# Patient Record
Sex: Male | Born: 1945 | Race: White | Hispanic: No | Marital: Married | Smoking: Never smoker
Health system: Southern US, Community
[De-identification: ages and names within clinical notes are randomized; demographics above are authoritative.]

## PROBLEM LIST (undated history)

## (undated) DIAGNOSIS — E7800 Pure hypercholesterolemia, unspecified: Secondary | ICD-10-CM

## (undated) DIAGNOSIS — I10 Essential (primary) hypertension: Secondary | ICD-10-CM

## (undated) DIAGNOSIS — I441 Atrioventricular block, second degree: Secondary | ICD-10-CM

## (undated) HISTORY — PX: COLONOSCOPY: SHX174

---

## 2003-01-02 ENCOUNTER — Ambulatory Visit (HOSPITAL_COMMUNITY): Admission: RE | Admit: 2003-01-02 | Discharge: 2003-01-02 | Payer: Self-pay | Admitting: *Deleted

## 2014-03-10 DIAGNOSIS — D124 Benign neoplasm of descending colon: Secondary | ICD-10-CM | POA: Diagnosis not present

## 2014-03-10 DIAGNOSIS — K640 First degree hemorrhoids: Secondary | ICD-10-CM | POA: Diagnosis not present

## 2014-03-10 DIAGNOSIS — K5730 Diverticulosis of large intestine without perforation or abscess without bleeding: Secondary | ICD-10-CM | POA: Diagnosis not present

## 2014-03-10 DIAGNOSIS — Z1211 Encounter for screening for malignant neoplasm of colon: Secondary | ICD-10-CM | POA: Diagnosis not present

## 2014-03-10 DIAGNOSIS — D123 Benign neoplasm of transverse colon: Secondary | ICD-10-CM | POA: Diagnosis not present

## 2014-03-10 DIAGNOSIS — D122 Benign neoplasm of ascending colon: Secondary | ICD-10-CM | POA: Diagnosis not present

## 2014-03-10 DIAGNOSIS — D126 Benign neoplasm of colon, unspecified: Secondary | ICD-10-CM | POA: Diagnosis not present

## 2014-03-10 DIAGNOSIS — D120 Benign neoplasm of cecum: Secondary | ICD-10-CM | POA: Diagnosis not present

## 2014-03-10 DIAGNOSIS — K633 Ulcer of intestine: Secondary | ICD-10-CM | POA: Diagnosis not present

## 2014-06-16 DIAGNOSIS — M109 Gout, unspecified: Secondary | ICD-10-CM | POA: Diagnosis not present

## 2014-06-16 DIAGNOSIS — E780 Pure hypercholesterolemia: Secondary | ICD-10-CM | POA: Diagnosis not present

## 2014-06-16 DIAGNOSIS — Z23 Encounter for immunization: Secondary | ICD-10-CM | POA: Diagnosis not present

## 2014-06-16 DIAGNOSIS — I10 Essential (primary) hypertension: Secondary | ICD-10-CM | POA: Diagnosis not present

## 2014-12-24 DIAGNOSIS — Z23 Encounter for immunization: Secondary | ICD-10-CM | POA: Diagnosis not present

## 2014-12-24 DIAGNOSIS — R7301 Impaired fasting glucose: Secondary | ICD-10-CM | POA: Diagnosis not present

## 2014-12-24 DIAGNOSIS — E7800 Pure hypercholesterolemia, unspecified: Secondary | ICD-10-CM | POA: Diagnosis not present

## 2014-12-24 DIAGNOSIS — I10 Essential (primary) hypertension: Secondary | ICD-10-CM | POA: Diagnosis not present

## 2014-12-24 DIAGNOSIS — M109 Gout, unspecified: Secondary | ICD-10-CM | POA: Diagnosis not present

## 2014-12-24 DIAGNOSIS — Z0000 Encounter for general adult medical examination without abnormal findings: Secondary | ICD-10-CM | POA: Diagnosis not present

## 2014-12-24 DIAGNOSIS — Z125 Encounter for screening for malignant neoplasm of prostate: Secondary | ICD-10-CM | POA: Diagnosis not present

## 2015-06-29 DIAGNOSIS — I10 Essential (primary) hypertension: Secondary | ICD-10-CM | POA: Diagnosis not present

## 2015-06-29 DIAGNOSIS — E7800 Pure hypercholesterolemia, unspecified: Secondary | ICD-10-CM | POA: Diagnosis not present

## 2015-06-29 DIAGNOSIS — M109 Gout, unspecified: Secondary | ICD-10-CM | POA: Diagnosis not present

## 2015-12-27 DIAGNOSIS — M109 Gout, unspecified: Secondary | ICD-10-CM | POA: Diagnosis not present

## 2015-12-27 DIAGNOSIS — Z0000 Encounter for general adult medical examination without abnormal findings: Secondary | ICD-10-CM | POA: Diagnosis not present

## 2015-12-27 DIAGNOSIS — I10 Essential (primary) hypertension: Secondary | ICD-10-CM | POA: Diagnosis not present

## 2015-12-27 DIAGNOSIS — E7800 Pure hypercholesterolemia, unspecified: Secondary | ICD-10-CM | POA: Diagnosis not present

## 2015-12-27 DIAGNOSIS — Z125 Encounter for screening for malignant neoplasm of prostate: Secondary | ICD-10-CM | POA: Diagnosis not present

## 2015-12-27 DIAGNOSIS — Z23 Encounter for immunization: Secondary | ICD-10-CM | POA: Diagnosis not present

## 2015-12-27 DIAGNOSIS — N529 Male erectile dysfunction, unspecified: Secondary | ICD-10-CM | POA: Diagnosis not present

## 2016-06-26 DIAGNOSIS — R001 Bradycardia, unspecified: Secondary | ICD-10-CM | POA: Diagnosis not present

## 2016-06-26 DIAGNOSIS — M109 Gout, unspecified: Secondary | ICD-10-CM | POA: Diagnosis not present

## 2016-06-26 DIAGNOSIS — E7800 Pure hypercholesterolemia, unspecified: Secondary | ICD-10-CM | POA: Diagnosis not present

## 2016-06-26 DIAGNOSIS — I10 Essential (primary) hypertension: Secondary | ICD-10-CM | POA: Diagnosis not present

## 2016-07-18 DIAGNOSIS — E7800 Pure hypercholesterolemia, unspecified: Secondary | ICD-10-CM | POA: Diagnosis not present

## 2016-07-18 DIAGNOSIS — I10 Essential (primary) hypertension: Secondary | ICD-10-CM | POA: Diagnosis not present

## 2016-12-27 DIAGNOSIS — Z86010 Personal history of colonic polyps: Secondary | ICD-10-CM | POA: Diagnosis not present

## 2016-12-27 DIAGNOSIS — I10 Essential (primary) hypertension: Secondary | ICD-10-CM | POA: Diagnosis not present

## 2016-12-27 DIAGNOSIS — E7800 Pure hypercholesterolemia, unspecified: Secondary | ICD-10-CM | POA: Diagnosis not present

## 2016-12-27 DIAGNOSIS — Z0000 Encounter for general adult medical examination without abnormal findings: Secondary | ICD-10-CM | POA: Diagnosis not present

## 2016-12-27 DIAGNOSIS — Z23 Encounter for immunization: Secondary | ICD-10-CM | POA: Diagnosis not present

## 2016-12-27 DIAGNOSIS — Z125 Encounter for screening for malignant neoplasm of prostate: Secondary | ICD-10-CM | POA: Diagnosis not present

## 2017-06-14 DIAGNOSIS — K5730 Diverticulosis of large intestine without perforation or abscess without bleeding: Secondary | ICD-10-CM | POA: Diagnosis not present

## 2017-06-14 DIAGNOSIS — D126 Benign neoplasm of colon, unspecified: Secondary | ICD-10-CM | POA: Diagnosis not present

## 2017-06-14 DIAGNOSIS — Z86010 Personal history of colonic polyps: Secondary | ICD-10-CM | POA: Diagnosis not present

## 2017-06-20 DIAGNOSIS — D126 Benign neoplasm of colon, unspecified: Secondary | ICD-10-CM | POA: Diagnosis not present

## 2017-06-27 DIAGNOSIS — I10 Essential (primary) hypertension: Secondary | ICD-10-CM | POA: Diagnosis not present

## 2017-06-27 DIAGNOSIS — E7800 Pure hypercholesterolemia, unspecified: Secondary | ICD-10-CM | POA: Diagnosis not present

## 2017-08-31 DIAGNOSIS — K5730 Diverticulosis of large intestine without perforation or abscess without bleeding: Secondary | ICD-10-CM | POA: Diagnosis not present

## 2017-08-31 DIAGNOSIS — D125 Benign neoplasm of sigmoid colon: Secondary | ICD-10-CM | POA: Diagnosis not present

## 2017-08-31 DIAGNOSIS — K635 Polyp of colon: Secondary | ICD-10-CM | POA: Diagnosis not present

## 2017-11-19 DIAGNOSIS — H5213 Myopia, bilateral: Secondary | ICD-10-CM | POA: Diagnosis not present

## 2018-01-30 DIAGNOSIS — Z125 Encounter for screening for malignant neoplasm of prostate: Secondary | ICD-10-CM | POA: Diagnosis not present

## 2018-01-30 DIAGNOSIS — M109 Gout, unspecified: Secondary | ICD-10-CM | POA: Diagnosis not present

## 2018-01-30 DIAGNOSIS — N529 Male erectile dysfunction, unspecified: Secondary | ICD-10-CM | POA: Diagnosis not present

## 2018-01-30 DIAGNOSIS — E7800 Pure hypercholesterolemia, unspecified: Secondary | ICD-10-CM | POA: Diagnosis not present

## 2018-01-30 DIAGNOSIS — M797 Fibromyalgia: Secondary | ICD-10-CM | POA: Diagnosis not present

## 2018-01-30 DIAGNOSIS — I10 Essential (primary) hypertension: Secondary | ICD-10-CM | POA: Diagnosis not present

## 2018-01-30 DIAGNOSIS — Z0000 Encounter for general adult medical examination without abnormal findings: Secondary | ICD-10-CM | POA: Diagnosis not present

## 2018-08-21 DIAGNOSIS — E7800 Pure hypercholesterolemia, unspecified: Secondary | ICD-10-CM | POA: Diagnosis not present

## 2018-08-21 DIAGNOSIS — F1020 Alcohol dependence, uncomplicated: Secondary | ICD-10-CM | POA: Diagnosis not present

## 2018-08-21 DIAGNOSIS — M797 Fibromyalgia: Secondary | ICD-10-CM | POA: Diagnosis not present

## 2018-08-21 DIAGNOSIS — I10 Essential (primary) hypertension: Secondary | ICD-10-CM | POA: Diagnosis not present

## 2018-08-21 DIAGNOSIS — M109 Gout, unspecified: Secondary | ICD-10-CM | POA: Diagnosis not present

## 2019-02-11 DIAGNOSIS — M109 Gout, unspecified: Secondary | ICD-10-CM | POA: Diagnosis not present

## 2019-02-11 DIAGNOSIS — E7800 Pure hypercholesterolemia, unspecified: Secondary | ICD-10-CM | POA: Diagnosis not present

## 2019-02-11 DIAGNOSIS — Z0000 Encounter for general adult medical examination without abnormal findings: Secondary | ICD-10-CM | POA: Diagnosis not present

## 2019-02-11 DIAGNOSIS — I10 Essential (primary) hypertension: Secondary | ICD-10-CM | POA: Diagnosis not present

## 2019-02-11 DIAGNOSIS — M797 Fibromyalgia: Secondary | ICD-10-CM | POA: Diagnosis not present

## 2019-02-11 DIAGNOSIS — N529 Male erectile dysfunction, unspecified: Secondary | ICD-10-CM | POA: Diagnosis not present

## 2019-02-20 DIAGNOSIS — E7800 Pure hypercholesterolemia, unspecified: Secondary | ICD-10-CM | POA: Diagnosis not present

## 2019-02-20 DIAGNOSIS — M797 Fibromyalgia: Secondary | ICD-10-CM | POA: Diagnosis not present

## 2019-02-20 DIAGNOSIS — M109 Gout, unspecified: Secondary | ICD-10-CM | POA: Diagnosis not present

## 2019-02-20 DIAGNOSIS — I10 Essential (primary) hypertension: Secondary | ICD-10-CM | POA: Diagnosis not present

## 2019-03-18 DIAGNOSIS — I10 Essential (primary) hypertension: Secondary | ICD-10-CM | POA: Diagnosis not present

## 2019-03-18 DIAGNOSIS — E7800 Pure hypercholesterolemia, unspecified: Secondary | ICD-10-CM | POA: Diagnosis not present

## 2019-05-23 DIAGNOSIS — E7800 Pure hypercholesterolemia, unspecified: Secondary | ICD-10-CM | POA: Diagnosis not present

## 2019-05-23 DIAGNOSIS — I10 Essential (primary) hypertension: Secondary | ICD-10-CM | POA: Diagnosis not present

## 2019-06-09 DIAGNOSIS — E7800 Pure hypercholesterolemia, unspecified: Secondary | ICD-10-CM | POA: Diagnosis not present

## 2019-06-09 DIAGNOSIS — I10 Essential (primary) hypertension: Secondary | ICD-10-CM | POA: Diagnosis not present

## 2019-07-02 DIAGNOSIS — J309 Allergic rhinitis, unspecified: Secondary | ICD-10-CM | POA: Diagnosis not present

## 2019-07-02 DIAGNOSIS — R0981 Nasal congestion: Secondary | ICD-10-CM | POA: Diagnosis not present

## 2019-07-22 DIAGNOSIS — I10 Essential (primary) hypertension: Secondary | ICD-10-CM | POA: Diagnosis not present

## 2019-07-22 DIAGNOSIS — E7800 Pure hypercholesterolemia, unspecified: Secondary | ICD-10-CM | POA: Diagnosis not present

## 2019-07-25 DIAGNOSIS — I10 Essential (primary) hypertension: Secondary | ICD-10-CM | POA: Diagnosis not present

## 2019-07-25 DIAGNOSIS — E7800 Pure hypercholesterolemia, unspecified: Secondary | ICD-10-CM | POA: Diagnosis not present

## 2019-08-14 DIAGNOSIS — J3489 Other specified disorders of nose and nasal sinuses: Secondary | ICD-10-CM | POA: Diagnosis not present

## 2019-08-14 DIAGNOSIS — J310 Chronic rhinitis: Secondary | ICD-10-CM | POA: Diagnosis not present

## 2019-08-27 DIAGNOSIS — J3489 Other specified disorders of nose and nasal sinuses: Secondary | ICD-10-CM | POA: Diagnosis not present

## 2019-08-27 DIAGNOSIS — J320 Chronic maxillary sinusitis: Secondary | ICD-10-CM | POA: Diagnosis not present

## 2019-08-27 DIAGNOSIS — J310 Chronic rhinitis: Secondary | ICD-10-CM | POA: Diagnosis not present

## 2019-08-27 DIAGNOSIS — J323 Chronic sphenoidal sinusitis: Secondary | ICD-10-CM | POA: Diagnosis not present

## 2019-08-27 DIAGNOSIS — J342 Deviated nasal septum: Secondary | ICD-10-CM | POA: Diagnosis not present

## 2019-08-28 DIAGNOSIS — J3489 Other specified disorders of nose and nasal sinuses: Secondary | ICD-10-CM | POA: Diagnosis not present

## 2019-08-28 DIAGNOSIS — J320 Chronic maxillary sinusitis: Secondary | ICD-10-CM | POA: Diagnosis not present

## 2019-08-29 NOTE — H&P (Signed)
HPI:   Chief Complaint  Patient presents with  . Follow-up  Patient is here for two weeks follow up on his sinus. He said there has been no change after taking medication.   Philip Anderson is a 74 y.o. male who presents for two week return visit for right sided chronic sinusitis. This is a new problem; duration of a few months. Always feels plugged up on the right side and right nostril has been constantly draining. Prior to his initial visit, the only medical therapy tried was Flonase for a few weeks. He recently finished a course of Augmentin and a medrol dose pack without improvement in symptoms. No history of previous sinus surgery.  He underwent maxillofacial CT imaging yesterday:  IMPRESSION: 1. Completely opacified right maxillary sinus with thinning or dehiscence of bone along both the posterior and superior walls, possible abnormal soft tissue in the right retro maxillary fat. 2. Suspected expansion of the right OMC and large, approximately 3.6 cm polypoid mass within the right nasal cavity which is subtotally opacified. 3. Associated right OMC pattern of obstructive sinus disease otherwise. 4. Left paranasal sinuses and left nasal cavity are well pneumatized. Note hyperplastic sphenoid sinuses. 5. Top differential considerations include right maxillary mucocele, antrochoanal polyp, inverted papilloma. _____________________________________________________ He denies fever, acute vision changes, headache, sore throat, dysphagia, odynophagia, cough or hoarseness.  No PMH of MI, CVA, diabetes, COPD, bleeding disorder, obstructive sleep apnea or adverse reaction to anesthesia.  Non-smoker.  PMH/Meds/All/SocHx/FamHx/ROS:   Past Medical History:  Diagnosis Date  . High cholesterol  . Hypertension with goal to be determined   History reviewed. No pertinent surgical history.  No family history of bleeding disorders, wound healing problems or difficulty with anesthesia.    Social History   Socioeconomic History  . Marital status: Single  Spouse name: Not on file  . Number of children: Not on file  . Years of education: Not on file  . Highest education level: Not on file  Occupational History  . Not on file  Tobacco Use  . Smoking status: Never Smoker  . Smokeless tobacco: Never Used  Substance and Sexual Activity  . Alcohol use: Yes  . Drug use: No  . Sexual activity: Not on file  Other Topics Concern  . Not on file  Social History Narrative  . Not on file   Social Determinants of Health   Financial Resource Strain:  . Difficulty of Paying Living Expenses:  Food Insecurity:  . Worried About Charity fundraiser in the Last Year:  . Arboriculturist in the Last Year:  Transportation Needs:  . Film/video editor (Medical):  Marland Kitchen Lack of Transportation (Non-Medical):  Physical Activity:  . Days of Exercise per Week:  . Minutes of Exercise per Session:  Stress:  . Feeling of Stress :  Social Connections:  . Frequency of Communication with Friends and Family:  . Frequency of Social Gatherings with Friends and Family:  . Attends Religious Services:  . Active Member of Clubs or Organizations:  . Attends Archivist Meetings:  Marland Kitchen Marital Status:   Current Outpatient Medications:  . allopurinol (ZYLOPRIM) 100 MG tablet, Take by mouth daily., Disp: , Rfl:  . aspirin 81 MG EC tablet *ANTIPLATELET*, Take by mouth daily., Disp: , Rfl:  . atorvastatin (LIPITOR) 20 MG tablet, Take 20 mg by mouth daily., Disp: , Rfl:  . diltiazem (CARTIA XT, CARDIZEM CD) 180 MG 24 hr capsule, Take 180 mg by mouth daily., Disp: ,  Rfl:  . doxazosin (CARDURA) 2 MG tablet, Take 2 mg by mouth nightly., Disp: , Rfl:   A complete ROS was performed with pertinent positives/negatives noted in the HPI. The remainder of the ROS are negative.   Physical Exam:   Temp 96.8 F (36 C)  Ht 1.753 m (5\' 9" )  Wt 85.7 kg (189 lb)  BMI 27.91 kg/m   General Awake,  at baseline alertness during examination.  Eyes No scleral icterus or conjunctival hemorrhage. Globe position appears normal. EOMI.  Right Ear EAC patent, TM intact w/o inflammation. Middle ear well aerated.  Left Ear EAC patent, TM intact w/o inflammation. Middle ear well aerated.  Nose Right nasal passage with mucopurulent discharge along the floor of nasal cavity. Left nasal passage patent, no polyps or masses seen on anterior rhinoscopy.  Oral cavity No mucosal lesions or tumors seen. Tongue midline.  Oropharynx Symmetric tonsils.  Neck No abnormal cervical lymphadenopathy. No thyromegaly. No thyroid masses palpated.  Cardio-vascular No cyanosis.  Pulmonary No audible stridor. Breathing easily with no labor.  Neuro Symmetric facial movement.  Psychiatry Appropriate affect and mood for clinic visit.   Independent Review of Additional Tests or Records:  Medical records, Radiology.  Procedures:  None  Impression & Plans:  Philip Anderson is a 74 y.o. male with a completely opacified right maxillary sinus with bony erosion. Scan reviewed with Dr. Constance Holster who recommends right sided endoscopic sinus surgery. Risks, benefits and post-operative recovery discussed. All questions and answers addressed. Consent obtained.

## 2019-09-08 ENCOUNTER — Other Ambulatory Visit (HOSPITAL_COMMUNITY)
Admission: RE | Admit: 2019-09-08 | Discharge: 2019-09-08 | Disposition: A | Discharge status: Home / Self Care | Payer: Medicare HMO | Source: Ambulatory Visit | Attending: Otolaryngology | Admitting: Otolaryngology

## 2019-09-08 DIAGNOSIS — Z01812 Encounter for preprocedural laboratory examination: Secondary | ICD-10-CM | POA: Diagnosis not present

## 2019-09-08 DIAGNOSIS — Z20822 Contact with and (suspected) exposure to covid-19: Secondary | ICD-10-CM | POA: Diagnosis not present

## 2019-09-08 LAB — SARS CORONAVIRUS 2 (TAT 6-24 HRS): SARS Coronavirus 2: NEGATIVE

## 2019-09-09 ENCOUNTER — Other Ambulatory Visit: Payer: Self-pay

## 2019-09-09 ENCOUNTER — Encounter (HOSPITAL_COMMUNITY): Payer: Self-pay | Admitting: Otolaryngology

## 2019-09-09 ENCOUNTER — Encounter (HOSPITAL_COMMUNITY): Payer: Self-pay | Admitting: Anesthesiology

## 2019-09-09 NOTE — Anesthesia Preprocedure Evaluation (Deleted)
Anesthesia Evaluation    Reviewed: Allergy & Precautions, Patient's Chart, lab work & pertinent test results  Airway        Dental   Pulmonary neg pulmonary ROS,           Cardiovascular hypertension, Pt. on medications      Neuro/Psych negative neurological ROS  negative psych ROS   GI/Hepatic negative GI ROS, (+)     substance abuse  alcohol use,   Endo/Other  negative endocrine ROS  Renal/GU negative Renal ROS  negative genitourinary   Musculoskeletal gout   Abdominal   Peds negative pediatric ROS (+)  Hematology negative hematology ROS (+)   Anesthesia Other Findings Chronic maxillary sinusitis   Reproductive/Obstetrics negative OB ROS                             Anesthesia Physical Anesthesia Plan  ASA: II  Anesthesia Plan: General   Post-op Pain Management:    Induction: Intravenous  PONV Risk Score and Plan: 2 and Ondansetron, Dexamethasone and Treatment may vary due to age or medical condition  Airway Management Planned: Oral ETT  Additional Equipment: None  Intra-op Plan:   Post-operative Plan: Extubation in OR  Informed Consent:   Plan Discussed with:   Anesthesia Plan Comments:         Anesthesia Quick Evaluation

## 2019-09-09 NOTE — Progress Notes (Signed)
Spoke with pt for pre-op call. Pt denies cardiac history. Pt is treated for HTN. Pt's PCP is Dr. Harlene Ramus. No recent EKG done per pt.   Covid test done 09/08/19 and it's negative. Pt states he's been in quarantine since having test done and understands that he stays in quarantine until he comes to the hospital tomorrow.

## 2019-09-10 ENCOUNTER — Ambulatory Visit (HOSPITAL_COMMUNITY): Admission: RE | Admit: 2019-09-10 | Payer: Medicare HMO | Source: Home / Self Care | Admitting: Otolaryngology

## 2019-09-10 HISTORY — DX: Pure hypercholesterolemia, unspecified: E78.00

## 2019-09-10 HISTORY — DX: Essential (primary) hypertension: I10

## 2019-09-10 SURGERY — MAXILLARY ANTROSTOMY
Anesthesia: General | Laterality: Right

## 2019-09-12 ENCOUNTER — Encounter (HOSPITAL_BASED_OUTPATIENT_CLINIC_OR_DEPARTMENT_OTHER): Payer: Self-pay | Admitting: Otolaryngology

## 2019-09-12 ENCOUNTER — Other Ambulatory Visit: Payer: Self-pay

## 2019-09-15 DIAGNOSIS — I10 Essential (primary) hypertension: Secondary | ICD-10-CM | POA: Diagnosis not present

## 2019-09-15 DIAGNOSIS — E7800 Pure hypercholesterolemia, unspecified: Secondary | ICD-10-CM | POA: Diagnosis not present

## 2019-09-16 ENCOUNTER — Encounter (HOSPITAL_COMMUNITY): Payer: Self-pay | Admitting: Anesthesiology

## 2019-09-16 ENCOUNTER — Encounter (HOSPITAL_BASED_OUTPATIENT_CLINIC_OR_DEPARTMENT_OTHER)
Admission: RE | Admit: 2019-09-16 | Discharge: 2019-09-16 | Disposition: A | Discharge status: Home / Self Care | Payer: Medicare HMO | Source: Ambulatory Visit | Attending: Otolaryngology | Admitting: Otolaryngology

## 2019-09-16 ENCOUNTER — Other Ambulatory Visit (HOSPITAL_COMMUNITY): Payer: Medicare HMO

## 2019-09-16 DIAGNOSIS — Z01810 Encounter for preprocedural cardiovascular examination: Secondary | ICD-10-CM | POA: Insufficient documentation

## 2019-09-16 DIAGNOSIS — Z01812 Encounter for preprocedural laboratory examination: Secondary | ICD-10-CM | POA: Insufficient documentation

## 2019-09-16 LAB — BASIC METABOLIC PANEL
Anion gap: 10 (ref 5–15)
BUN: 11 mg/dL (ref 8–23)
CO2: 26 mmol/L (ref 22–32)
Calcium: 9.4 mg/dL (ref 8.9–10.3)
Chloride: 102 mmol/L (ref 98–111)
Creatinine, Ser: 1.16 mg/dL (ref 0.61–1.24)
GFR calc Af Amer: >60 mL/min (ref >60)
GFR calc non Af Amer: >60 mL/min (ref >60)
Glucose, Bld: 120 mg/dL — ABNORMAL HIGH (ref 70–99)
Potassium: 4.7 mmol/L (ref 3.5–5.1)
Sodium: 138 mmol/L (ref 135–145)

## 2019-09-16 NOTE — Progress Notes (Signed)
EKG reviewed by Dr. Lanetta Inch, pt will need to see cardiology prior to surgery. Pt denies chest pain or SOB. Left message for Anderson Malta at Dr. Janeice Robinson office, explained to pt the plan of care, pt verbalized understanding.

## 2019-09-18 ENCOUNTER — Ambulatory Visit: Payer: Medicare HMO | Admitting: Cardiology

## 2019-09-18 ENCOUNTER — Other Ambulatory Visit: Payer: Self-pay | Admitting: Cardiology

## 2019-09-18 ENCOUNTER — Other Ambulatory Visit: Payer: Self-pay

## 2019-09-18 ENCOUNTER — Encounter: Payer: Self-pay | Admitting: Cardiology

## 2019-09-18 VITALS — BP 141/72 | HR 61 | Resp 17 | Ht 69.0 in | Wt 184.0 lb

## 2019-09-18 DIAGNOSIS — I455 Other specified heart block: Secondary | ICD-10-CM

## 2019-09-18 DIAGNOSIS — R001 Bradycardia, unspecified: Secondary | ICD-10-CM | POA: Diagnosis not present

## 2019-09-18 DIAGNOSIS — I442 Atrioventricular block, complete: Secondary | ICD-10-CM | POA: Diagnosis not present

## 2019-09-18 DIAGNOSIS — Z01818 Encounter for other preprocedural examination: Secondary | ICD-10-CM

## 2019-09-18 NOTE — Progress Notes (Signed)
Patient referred by Aretta Nip, MD for abnormal EKG  Subjective:   Philip Anderson, male    DOB: [birth year], 74 y.o.   MRN: [id number]   Chief Complaint  Patient presents with  . Medical Clearance  . New Patient (Initial Visit)     HPI   74 y.o. Caucasian male with abnormal EKG  Patient has a right nasal polyp that has been causing him nasal drainage, breathing difficulty for last several months.  Patient is scheduled to undergo endoscopy sinus surgery.  However, on preop work-up, he was found to have abnormal EKG.  He was thus referred for further evaluation.  Patient lives with his wife.  At baseline, he is active, walking up to 2 miles several days a week.  However, the last 4 months, he has not been able to do his regular physical activity, which he attributes due to shortness of breath arising from the nasal polyp.  He denies any chest pain, presyncope, syncope, leg edema, orthopnea, PND.  EKG performed on 09/16/2019, as well as the one performed today reviewed with the patient.  Details below.   Past Medical History:  Diagnosis Date  . High cholesterol   . Hypertension      Past Surgical History:  Procedure Laterality Date  . COLONOSCOPY       Social History   Tobacco Use  Smoking Status Never Smoker  Smokeless Tobacco Never Used    Social History   Substance and Sexual Activity  Alcohol Use Yes  . Alcohol/week: 14.0 standard drinks  . Types: 14 Standard drinks or equivalent per week     Family History  Problem Relation Age of Onset  . Heart disease Mother   . Heart disease Father   . Diabetes Brother   . Hypertension Brother      Current Outpatient Medications on File Prior to Visit  Medication Sig Dispense Refill  . allopurinol (ZYLOPRIM) 100 MG tablet Take 100 mg by mouth daily.    . ALPHA LIPOIC ACID PO Take 1 capsule by mouth daily.    . APPLE CIDER VINEGAR PO Take 1 capsule by mouth 3 (three) times a week.    Marland Kitchen aspirin EC 81 MG  tablet Take 81 mg by mouth daily. Swallow whole.    Marland Kitchen atorvastatin (LIPITOR) 20 MG tablet Take 20 mg by mouth daily.    Marland Kitchen doxazosin (CARDURA) 2 MG tablet Take 4 mg by mouth at bedtime.    Marland Kitchen lisinopril-hydrochlorothiazide (ZESTORETIC) 10-12.5 MG tablet Take 1 tablet by mouth daily.    . Multiple Vitamin (MULTIVITAMIN WITH MINERALS) TABS tablet Take 1 tablet by mouth daily.    Marland Kitchen oxymetazoline (AFRIN) 0.05 % nasal spray Place 2 sprays into both nostrils at bedtime.    Deborah Chalk ER 180 MG 24 hr capsule Take 180 mg by mouth daily.     No current facility-administered medications on file prior to visit.    Cardiovascular and other pertinent studies:  EKG 09/18/2019: Sinus rhythm SInus arrest with junctional escape beat Mobitz type 1 AV block Cannot exclude old inferior infarct Compared to EKG on 09/16/2019, third degree AV block now absent  EKG 09/16/2019: Sinus rhythm with third degree AV block with junctional escape beats Occasional PVC  Recent labs: 09/16/2019: Glucose 120, BUN/Cr 11/1.16. EGFR >60 patient is. Na/K 138/4.7.     Review of Systems  HENT:       Nasal discharge Difficulty breathing  Cardiovascular: Negative for chest pain, dyspnea on exertion,  leg swelling, palpitations and syncope.  Respiratory: Positive for shortness of breath.          Vitals:   09/18/19 1051 09/18/19 1054  BP: (!) 152/69 (!) 141/72  Pulse: (!) 50 61  Resp: 17   SpO2: 99%      Body mass index is 27.17 kg/m. Filed Weights   09/18/19 1051  Weight: 184 lb (83.5 kg)     Objective:   Physical Exam Vitals and nursing note reviewed.  Constitutional:      General: He is not in acute distress. Neck:     Vascular: No JVD.  Cardiovascular:     Rate and Rhythm: Bradycardia present. Rhythm irregular.     Heart sounds: Normal heart sounds. No murmur heard.   Pulmonary:     Effort: Pulmonary effort is normal.     Breath sounds: Normal breath sounds. No wheezing or rales.      Independently reviewed previous work-up including EKG.     Assessment & Recommendations:   74 y.o. Caucasian male with abnormal EKG  Abnormal EKG:  Reviewed and compared on 09/16/2019, and 09/18/2019. EKG 2 days ago showing third-degree block with junctional escape beats and occasional PVC.  While EKG today does not show third-degree block, it shows sinus bradycardia with junctional escape beats, and type I AV block. I am concerned about his sinus node as well as AV node function.  Surprisingly, he is completely asymptomatic with this.  He is not on any AV nodal blocking agents.  I am checking TSH.  I do not suspect ischemia as the etiology of his sinus and AV node dysfunction.  Only remotely possible reversible causes would be doxazosin.  I have stopped doxazosin.  We will also check echocardiogram.  I am concerned about his ability to undergo surgery under anesthesia given his unstable sinus and AV node function.  I will refer him to electrophysiology for further evaluation.  This will also tell us if stopping doxazosin improves his sinus and AV nodal function.   I explained the implications of third-degree AV block and sinus node dysfunction.  While this could potentially be life-threatening condition, fortunately he does not have any symptoms.  Therefore, I do not believe he is in any imminent danger and does not need to go to emergency room.  Given his unstable conduction, I will allow for permissive hypertension for now.  I will see him on as-needed basis. Defer further recommendation to EP.    Thank you for referring the patient to [place]. Please feel free to contact with any questions.   Nigel Mormon, MD Pager: [phone] Office: [phone]

## 2019-09-19 ENCOUNTER — Ambulatory Visit (HOSPITAL_BASED_OUTPATIENT_CLINIC_OR_DEPARTMENT_OTHER): Admission: RE | Admit: 2019-09-19 | Payer: Medicare HMO | Source: Home / Self Care | Admitting: Otolaryngology

## 2019-09-19 LAB — TSH: TSH: 0.897 u[IU]/mL (ref 0.450–4.500)

## 2019-09-19 SURGERY — SINUS SURGERY, ENDOSCOPIC
Anesthesia: General | Laterality: Bilateral

## 2019-09-23 NOTE — Progress Notes (Signed)
Electrophysiology Office Note:    Date:  09/24/2019   ID:  Philip Anderson, DOB 1945/10/03, MRN [id number]  PCP:  Vickie Epley, MD  [place] Cardiologist:  No primary care provider on file.  [facility] Electrophysiologist:  None   Referring MD: Aretta Nip, MD   Chief Complaint: Abnormal EKG  History of Present Illness:    Philip Anderson is a 74 y.o. male with a hx of hypertension and hyperlipidemia presents to the clinic for an opinion regarding an abnormal EKG obtained as part of a preoperative exam before colonoscopy.  He was seen by Dr. Virgina Jock who recommended he see electrophysiology. He denies any lightheadedness/dizziness/syncope. He works in the [employer], etc without problem. He is undergoing evaluation for nasal surgery.  Past Medical History:  Diagnosis Date  . High cholesterol   . Hypertension     Past Surgical History:  Procedure Laterality Date  . COLONOSCOPY      Current Medications: Current Meds  Medication Sig  . allopurinol (ZYLOPRIM) 100 MG tablet Take 100 mg by mouth daily.  . ALPHA LIPOIC ACID PO Take 1 capsule by mouth daily.  . APPLE CIDER VINEGAR PO Take 1 capsule by mouth 3 (three) times a week.  Marland Kitchen aspirin EC 81 MG tablet Take 81 mg by mouth daily. Swallow whole.  Marland Kitchen atorvastatin (LIPITOR) 20 MG tablet Take 20 mg by mouth daily.  . Coenzyme Q10 (CO Q 10 PO) Take by mouth.  . Flax Oil-Fish Oil-Borage Oil (FISH-FLAX-BORAGE PO) Take by mouth.  Marland Kitchen lisinopril-hydrochlorothiazide (ZESTORETIC) 10-12.5 MG tablet Take 1 tablet by mouth every other day.   . magnesium oxide (MAG-OX) 400 MG tablet Take 400 mg by mouth daily.  . Multiple Vitamin (MULTIVITAMIN WITH MINERALS) TABS tablet Take 1 tablet by mouth daily.  Marland Kitchen oxymetazoline (AFRIN) 0.05 % nasal spray Place 2 sprays into both nostrils at bedtime.  Deborah Chalk ER 180 MG 24 hr capsule Take 180 mg by mouth daily.     Allergies:   Patient has no known allergies.   Social History    Socioeconomic History  . Marital status: Married    Spouse name: Not on file  . Number of children: 0  . Years of education: Not on file  . Highest education level: Not on file  Occupational History  . Not on file  Tobacco Use  . Smoking status: Never Smoker  . Smokeless tobacco: Never Used  Vaping Use  . Vaping Use: Never used  Substance and Sexual Activity  . Alcohol use: Yes    Alcohol/week: 14.0 standard drinks    Types: 14 Standard drinks or equivalent per week  . Drug use: Not Currently  . Sexual activity: Not on file  Other Topics Concern  . Not on file  Social History Narrative  . Not on file   Social Determinants of Health   Financial Resource Strain:   . Difficulty of Paying Living Expenses: Not on file  Food Insecurity:   . Worried About Charity fundraiser in the Last Year: Not on file  . Ran Out of Food in the Last Year: Not on file  Transportation Needs:   . Lack of Transportation (Medical): Not on file  . Lack of Transportation (Non-Medical): Not on file  Physical Activity:   . Days of Exercise per Week: Not on file  . Minutes of Exercise per Session: Not on file  Stress:   . Feeling of Stress : Not on file  Social Connections:   .  Frequency of Communication with Friends and Family: Not on file  . Frequency of Social Gatherings with Friends and Family: Not on file  . Attends Religious Services: Not on file  . Active Member of Clubs or Organizations: Not on file  . Attends Archivist Meetings: Not on file  . Marital Status: Not on file     Family History: The patient's family history includes Diabetes in his brother; Heart disease in his father and mother; Hypertension in his brother.  ROS:   Please see the history of present illness.    All other systems reviewed and are negative.  EKGs/Labs/Other Studies Reviewed:    The following studies were reviewed today: EKG  09/16/2019 ECG  Sinus rhythm with second-degree Mobitz 1 heart  block.  Single PVC.   09/18/2019 ECG  Second-degree Mobitz 1 heart block.  Sinus rhythm.  EKG:  The ekg ordered today demonstrates Mobitz I / Wenckebach AV conduction. No evidence of CHB.   Recent Labs: 09/16/2019: BUN 11; Creatinine, Ser 1.16; Potassium 4.7; Sodium 138 09/18/2019: TSH 0.897  Recent Lipid Panel No results found for: CHOL, TRIG, HDL, CHOLHDL, VLDL, LDLCALC, LDLDIRECT  Physical Exam:    VS:  BP (!) 144/88   Pulse (!) 54   Ht 5\' 9"  (1.753 m)   Wt 189 lb (85.7 kg)   SpO2 98%   BMI 27.91 kg/m     Wt Readings from Last 3 Encounters:  09/24/19 189 lb (85.7 kg)  09/18/19 184 lb (83.5 kg)     GEN:  Well nourished, well developed in no acute distress HEENT: Normal NECK: No JVD; No carotid bruits LYMPHATICS: No lymphadenopathy CARDIAC: irregular rhythm, no murmurs, rubs, gallops RESPIRATORY:  Clear to auscultation without rales, wheezing or rhonchi  ABDOMEN: Soft, non-tender, non-distended MUSCULOSKELETAL:  No edema; No deformity  SKIN: Warm and dry NEUROLOGIC:  Alert and oriented x 3 PSYCHIATRIC:  Normal affect   ASSESSMENT:    1. Mobitz type 1 second degree atrioventricular block    PLAN:    In order of problems listed above:  1. Second-degree Mobitz 1 (Wenckebach)AV block All ECGs in our system show wenckebach av nodal conduction. No evidence that I have seen of complete heart block. At this point, there is no indication for a permanent pacemaker.  To confirm chronotropic competence and AV conduction with exercise, will perform treadmill ECG. Plan to see in clinic after treadmill test performed.    Medication Adjustments/Labs and Tests Ordered: Current medicines are reviewed at length with the patient today.  Concerns regarding medicines are outlined above.  Orders Placed This Encounter  Procedures  . Exercise Tolerance Test  . EKG 12-Lead  . ECHOCARDIOGRAM COMPLETE   No orders of the defined types were placed in this encounter.   Patient  Instructions  Medication Instructions:  Your physician recommends that you continue on your current medications as directed. Please refer to the Current Medication list given to you today.  *If you need a refill on your cardiac medications before your next appointment, please call your pharmacy*   Lab Work: You will need to be Covid screened prior to your treadmill test. COVID TEST-- On ___________ @ ____________-  This is a Drive Up Visit at [address] [place], [place] [address].  Someone will direct you to the appropriate testing line. Stay in your car and someone will be with you shortly.    Testing/Procedures: Your physician has requested that you have an exercise tolerance test. For further  information please visit [email or web address]. Please also follow instruction sheet, as given.  Your physician has requested that you have an echocardiogram. Echocardiography is a painless test that uses sound waves to create images of your heart. It provides your doctor with information about the size and shape of your heart and how well your heart's chambers and valves are working. This procedure takes approximately one hour. There are no restrictions for this procedure.   Follow-Up: At [facility], you and your health needs are our priority.  As part of our continuing mission to provide you with exceptional heart care, we have created designated Provider Care Teams.  These Care Teams include your primary Cardiologist (physician) and Advanced Practice Providers (APPs -  Physician Assistants and Nurse Practitioners) who all work together to provide you with the care you need, when you need it.  We recommend signing up for the patient portal called "MyChart".  Sign up information is provided on this After Visit Summary.  MyChart is used to connect with patients for Virtual Visits (Telemedicine).  Patients are able to view lab/test results, encounter notes, upcoming appointments, etc.  Non-urgent  messages can be sent to your provider as well.   To learn more about what you can do with MyChart, go to [email or web address].    Your next appointment:   4 week(s)  The format for your next appointment:   In Person  Provider:   Dr. Quentin Ore   Thank you for choosing [facility]!!     Other Instructions     Signed, Lars Mage, MD, [facility]  09/24/2019 3:05 PM    Electrophysiology [place]

## 2019-09-24 ENCOUNTER — Encounter: Payer: Self-pay | Admitting: Cardiology

## 2019-09-24 ENCOUNTER — Ambulatory Visit: Payer: Medicare HMO | Admitting: Cardiology

## 2019-09-24 ENCOUNTER — Other Ambulatory Visit: Payer: Self-pay

## 2019-09-24 VITALS — BP 144/88 | HR 54 | Ht 69.0 in | Wt 189.0 lb

## 2019-09-24 DIAGNOSIS — I441 Atrioventricular block, second degree: Secondary | ICD-10-CM

## 2019-09-24 NOTE — Patient Instructions (Addendum)
Medication Instructions:  Your physician recommends that you continue on your current medications as directed. Please refer to the Current Medication list given to you today.  *If you need a refill on your cardiac medications before your next appointment, please call your pharmacy*   Lab Work: You will need to be Covid screened prior to your treadmill test. COVID TEST-- On ___________ @ ____________-  This is a Drive Up Visit at [address] [place], [place] [address].  Someone will direct you to the appropriate testing line. Stay in your car and someone will be with you shortly.    Testing/Procedures: Your physician has requested that you have an exercise tolerance test. For further information please visit [email or web address].    DO NOT drink or eat foods with caffeine for 24 hours before the test. (Chocolate, coffee, tea, decaf coffee/tea, or energy drinks)  DO NOT smoke for 4 hours before your test.  If you use an inhaler, bring it with you to the test.  Wear comfortable shoes and clothing. Women do not wear dresses.   Your physician has requested that you have an echocardiogram. Echocardiography is a painless test that uses sound waves to create images of your heart. It provides your doctor with information about the size and shape of your heart and how well your heart's chambers and valves are working. This procedure takes approximately one hour. There are no restrictions for this procedure.   Follow-Up: At [facility], you and your health needs are our priority.  As part of our continuing mission to provide you with exceptional heart care, we have created designated Provider Care Teams.  These Care Teams include your primary Cardiologist (physician) and Advanced Practice Providers (APPs -  Physician Assistants and Nurse Practitioners) who all work together to provide you with the care you need, when you need it.  We recommend signing up for the patient portal called  "MyChart".  Sign up information is provided on this After Visit Summary.  MyChart is used to connect with patients for Virtual Visits (Telemedicine).  Patients are able to view lab/test results, encounter notes, upcoming appointments, etc.  Non-urgent messages can be sent to your provider as well.   To learn more about what you can do with MyChart, go to [email or web address].    Your next appointment:   4 week(s)  (ok to have on same day as echo)  The format for your next appointment:   In Person  Provider:   Dr. Quentin Ore   Thank you for choosing [facility]!!     Other Instructions

## 2019-09-24 NOTE — Addendum Note (Signed)
Addended by: Lars Mage T on: 09/24/2019 03:44 PM   Modules accepted: Level of Service

## 2019-10-03 ENCOUNTER — Other Ambulatory Visit (HOSPITAL_COMMUNITY)
Admission: RE | Admit: 2019-10-03 | Discharge: 2019-10-03 | Disposition: A | Discharge status: Home / Self Care | Payer: Medicare HMO | Source: Ambulatory Visit | Attending: Cardiology | Admitting: Cardiology

## 2019-10-03 DIAGNOSIS — Z20822 Contact with and (suspected) exposure to covid-19: Secondary | ICD-10-CM | POA: Insufficient documentation

## 2019-10-03 DIAGNOSIS — Z01812 Encounter for preprocedural laboratory examination: Secondary | ICD-10-CM | POA: Diagnosis not present

## 2019-10-03 LAB — SARS CORONAVIRUS 2 (TAT 6-24 HRS): SARS Coronavirus 2: NEGATIVE

## 2019-10-07 ENCOUNTER — Ambulatory Visit (HOSPITAL_COMMUNITY): Payer: Medicare HMO | Attending: Cardiovascular Disease

## 2019-10-07 ENCOUNTER — Other Ambulatory Visit: Payer: Self-pay

## 2019-10-07 ENCOUNTER — Ambulatory Visit (INDEPENDENT_AMBULATORY_CARE_PROVIDER_SITE_OTHER): Payer: Medicare HMO

## 2019-10-07 DIAGNOSIS — I441 Atrioventricular block, second degree: Secondary | ICD-10-CM

## 2019-10-07 LAB — EXERCISE TOLERANCE TEST
Estimated workload: 7 METS
Exercise duration (min): 4 min
Exercise duration (sec): 15 s
MPHR: 147 {beats}/min
Peak HR: 125 {beats}/min
Percent HR: 85 %
RPE: 15
Rest HR: 64 {beats}/min

## 2019-10-07 LAB — ECHOCARDIOGRAM COMPLETE
Area-P 1/2: 2.62 cm2
S' Lateral: 3.6 cm

## 2019-10-09 NOTE — Progress Notes (Signed)
Electrophysiology Office Note:    Date:  10/10/2019   ID:  Philip Anderson, DOB 09-28-1945, MRN [id number]  PCP:  Aretta Nip, MD  [place] Cardiologist:  No primary care provider on file.  [facility] Electrophysiologist:  Vickie Epley, MD   Referring MD: Vickie Epley, MD   Chief Complaint: AV conduction disease  History of Present Illness:    Philip Anderson is a 74 y.o. male who presents for follow up. He last saw me on 09/24/2019 for asymptomatic AV conduction system disease. Since that visit he received an echo which showed normal LV function and no significant valvular abnormalities. He also had an exercise ecg test during which he was able to achieve 85% (peak HR 125bpm) of his maximum predicted heart rate for 7 METS.   Past Medical History:  Diagnosis Date  . High cholesterol   . Hypertension     Past Surgical History:  Procedure Laterality Date  . COLONOSCOPY      Current Medications: Current Meds  Medication Sig  . allopurinol (ZYLOPRIM) 100 MG tablet Take 100 mg by mouth daily.  . ALPHA LIPOIC ACID PO Take 1 capsule by mouth daily.  . APPLE CIDER VINEGAR PO Take 1 capsule by mouth 3 (three) times a week.  Marland Kitchen aspirin EC 81 MG tablet Take 81 mg by mouth daily. Swallow whole.  Marland Kitchen atorvastatin (LIPITOR) 20 MG tablet Take 20 mg by mouth daily.  . Coenzyme Q10 (CO Q 10 PO) Take by mouth.  . Flax Oil-Fish Oil-Borage Oil (FISH-FLAX-BORAGE PO) Take by mouth.  Marland Kitchen lisinopril-hydrochlorothiazide (ZESTORETIC) 10-12.5 MG tablet Take 1 tablet by mouth every other day.   . magnesium oxide (MAG-OX) 400 MG tablet Take 400 mg by mouth daily.  . Multiple Vitamin (MULTIVITAMIN WITH MINERALS) TABS tablet Take 1 tablet by mouth daily.  Marland Kitchen oxymetazoline (AFRIN) 0.05 % nasal spray Place 2 sprays into both nostrils at bedtime.  Deborah Chalk ER 180 MG 24 hr capsule Take 180 mg by mouth daily.     Allergies:   Patient has no known allergies.   Social History    Socioeconomic History  . Marital status: Married    Spouse name: Not on file  . Number of children: 0  . Years of education: Not on file  . Highest education level: Not on file  Occupational History  . Not on file  Tobacco Use  . Smoking status: Never Smoker  . Smokeless tobacco: Never Used  Vaping Use  . Vaping Use: Never used  Substance and Sexual Activity  . Alcohol use: Yes    Alcohol/week: 14.0 standard drinks    Types: 14 Standard drinks or equivalent per week  . Drug use: Not Currently  . Sexual activity: Not on file  Other Topics Concern  . Not on file  Social History Narrative  . Not on file   Social Determinants of Health   Financial Resource Strain:   . Difficulty of Paying Living Expenses: Not on file  Food Insecurity:   . Worried About Charity fundraiser in the Last Year: Not on file  . Ran Out of Food in the Last Year: Not on file  Transportation Needs:   . Lack of Transportation (Medical): Not on file  . Lack of Transportation (Non-Medical): Not on file  Physical Activity:   . Days of Exercise per Week: Not on file  . Minutes of Exercise per Session: Not on file  Stress:   . Feeling of  Stress : Not on file  Social Connections:   . Frequency of Communication with Friends and Family: Not on file  . Frequency of Social Gatherings with Friends and Family: Not on file  . Attends Religious Services: Not on file  . Active Member of Clubs or Organizations: Not on file  . Attends Archivist Meetings: Not on file  . Marital Status: Not on file     Family History: The patient's family history includes Diabetes in his brother; Heart disease in his father and mother; Hypertension in his brother.  ROS:   Please see the history of present illness.    All other systems reviewed and are negative.  EKGs/Labs/Other Studies Reviewed:    The following studies were reviewed today: Treadmill ECG test   10/07/2019 Treadmill ECG  Sinus tachycardia  during stress portion of exam.    10/07/2019 Echo personally reviewed by me Normal EF65% RV normal No significant valvular abnormalities    EKG:  The ekg ordered today demonstrates second degree (Mobitz 1) with competing junctional rhythm.   Recent Labs: 09/16/2019: BUN 11; Creatinine, Ser 1.16; Potassium 4.7; Sodium 138 09/18/2019: TSH 0.897  Recent Lipid Panel No results found for: CHOL, TRIG, HDL, CHOLHDL, VLDL, LDLCALC, LDLDIRECT  Physical Exam:    VS:  BP (!) 164/82   Pulse (!) 46   Ht 5\' 9"  (1.753 m)   Wt 181 lb 3.2 oz (82.2 kg)   SpO2 98%   BMI 26.76 kg/m     Wt Readings from Last 3 Encounters:  10/10/19 181 lb 3.2 oz (82.2 kg)  09/24/19 189 lb (85.7 kg)  09/18/19 184 lb (83.5 kg)     GEN:  Well nourished, well developed in no acute distress HEENT: Normal NECK: No JVD; No carotid bruits LYMPHATICS: No lymphadenopathy CARDIAC: bradycardia, no murmurs, rubs, gallops RESPIRATORY:  Clear to auscultation without rales, wheezing or rhonchi  ABDOMEN: Soft, non-tender, non-distended MUSCULOSKELETAL:  No edema; No deformity  SKIN: Warm and dry NEUROLOGIC:  Alert and oriented x 3 PSYCHIATRIC:  Normal affect   ASSESSMENT:    1. Mobitz type 1 second degree atrioventricular block   2. Accelerated junctional rhythm    PLAN:    In order of problems listed above:  1. Second degree (Mobitz I) AV block with a competing junctional escape rhythm Good chronotropic competence on treadmill ECG test. His AV conduction improved with exertion which is typical for block at the sinus node level (Mobitz I). His response to exercise would NOT be typical for intrahisian or infrahisian block.  For this reason, I do not think he is at a particularly increased risk of developing complete heart block during anaesthesia. I would recommend he be on a heart monitor during any procedure involving sedation. I suspect his heart rates at rest (during anaesthesia) to be in the 40s.  Will plan on  seeing him on an annual basis with ECG in clinic to confirm no progression of conduction abnormalities.    Medication Adjustments/Labs and Tests Ordered: Current medicines are reviewed at length with the patient today.  Concerns regarding medicines are outlined above.  Orders Placed This Encounter  Procedures  . EKG 12-Lead   No orders of the defined types were placed in this encounter.    Signed, Lars Mage, MD, [facility]  10/10/2019 10:22 AM    Electrophysiology [facility]

## 2019-10-10 ENCOUNTER — Encounter: Payer: Self-pay | Admitting: Cardiology

## 2019-10-10 ENCOUNTER — Ambulatory Visit: Payer: Medicare HMO | Admitting: Cardiology

## 2019-10-10 ENCOUNTER — Other Ambulatory Visit: Payer: Self-pay

## 2019-10-10 VITALS — BP 164/82 | HR 46 | Ht 69.0 in | Wt 181.2 lb

## 2019-10-10 DIAGNOSIS — I498 Other specified cardiac arrhythmias: Secondary | ICD-10-CM

## 2019-10-10 DIAGNOSIS — I441 Atrioventricular block, second degree: Secondary | ICD-10-CM

## 2019-10-10 NOTE — Patient Instructions (Signed)
Medication Instructions:  Your physician recommends that you continue on your current medications as directed. Please refer to the Current Medication list given to you today.  *If you need a refill on your cardiac medications before your next appointment, please call your pharmacy*  Lab Work: None ordered.  If you have labs (blood work) drawn today and your tests are completely normal, you will receive your results only by: Marland Kitchen MyChart Message (if you have MyChart) OR . A paper copy in the mail If you have any lab test that is abnormal or we need to change your treatment, we will call you to review the results.  Testing/Procedures: None ordered.  Follow-Up: At [facility], you and your health needs are our priority.  As part of our continuing mission to provide you with exceptional heart care, we have created designated Provider Care Teams.  These Care Teams include your primary Cardiologist (physician) and Advanced Practice Providers (APPs -  Physician Assistants and Nurse Practitioners) who all work together to provide you with the care you need, when you need it.  We recommend signing up for the patient portal called "MyChart".  Sign up information is provided on this After Visit Summary.  MyChart is used to connect with patients for Virtual Visits (Telemedicine).  Patients are able to view lab/test results, encounter notes, upcoming appointments, etc.  Non-urgent messages can be sent to your provider as well.   To learn more about what you can do with MyChart, go to [email or web address].    Your next appointment:   Your physician wants you to follow-up in: one year with Dr. Quentin Ore.  You will receive a reminder letter in the mail two months in advance. If you don't receive a letter, please call our office to schedule the follow-up appointment.

## 2019-10-13 ENCOUNTER — Telehealth: Payer: Self-pay | Admitting: Cardiology

## 2019-10-13 DIAGNOSIS — I10 Essential (primary) hypertension: Secondary | ICD-10-CM | POA: Diagnosis not present

## 2019-10-13 DIAGNOSIS — E7800 Pure hypercholesterolemia, unspecified: Secondary | ICD-10-CM | POA: Diagnosis not present

## 2019-10-13 NOTE — Telephone Encounter (Signed)
New message:   Patient calling concerning his notes and medical clearance.

## 2019-10-13 NOTE — Telephone Encounter (Signed)
Returned call to Pt.  Advised had forwarded Dr. Mardene Speak office note to Dr. Constance Holster and Dr. Virgina Jock this morning 10/13/2019.  Advised both physicians should have the record.  Pt thanked nurse for call back.

## 2019-11-05 NOTE — H&P (Signed)
HPI:   Cc: right sided nasal congestion x 2 mo.  Philip Anderson is a 74 y.o. male who presents as a new patient for right sided nasal obstruction and drainage. This is a new problem. Course is stable. He never had problems with allergies or sinusitis. This past spring he feels he may have experienced new allergy symptoms. He developed significant right sided nasal obstruction and feels like he can never fully blow the "mucous plug" out of the nose. Sometimes notices blood tinged mucous but no frank epistaxis. Left side is breathing well; he uses a decongestant nasal spray nightly in the left side to help with night time breathing. He used Flonase for two weeks without any benefit. No other medical therapy. No imaging of the sinuses.  Denies fever, acute vision changes, headache, facial swelling, facial numbness/tingling. No ear or throat symptoms.  No PMH of asthma, allergies, aspirin sensitivity, diabetes or bleeding disorder.  Non-smoker.  PMH/Meds/All/SocHx/FamHx/ROS:   Past Medical History:  Diagnosis Date  . High cholesterol  . Hypertension with goal to be determined   No past surgical history on file.  No family history of bleeding disorders, wound healing problems or difficulty with anesthesia.   Social History   Socioeconomic History  . Marital status: Single  Spouse name: Not on file  . Number of children: Not on file  . Years of education: Not on file  . Highest education level: Not on file  Occupational History  . Not on file  Tobacco Use  . Smoking status: Never Smoker  . Smokeless tobacco: Never Used  Substance and Sexual Activity  . Alcohol use: Yes  . Drug use: No  . Sexual activity: Not on file  Other Topics Concern  . Not on file  Social History Narrative  . Not on file   Social Determinants of Health   Financial Resource Strain:  . Difficulty of Paying Living Expenses:  Food Insecurity:  . Worried About Charity fundraiser in the Last Year:  . Arts development officer in the Last Year:  Transportation Needs:  . Film/video editor (Medical):  Marland Kitchen Lack of Transportation (Non-Medical):  Physical Activity:  . Days of Exercise per Week:  . Minutes of Exercise per Session:  Stress:  . Feeling of Stress :  Social Connections:  . Frequency of Communication with Friends and Family:  . Frequency of Social Gatherings with Friends and Family:  . Attends Religious Services:  . Active Member of Clubs or Organizations:  . Attends Archivist Meetings:  Marland Kitchen Marital Status:   Current Outpatient Medications:  . allopurinol (ZYLOPRIM) 100 MG tablet, Take by mouth daily., Disp: , Rfl:  . amoxicillin-clavulanate (AUGMENTIN) 875-125 mg per tablet, Take 1 tablet by mouth 2 times daily for 10 days., Disp: 20 tablet, Rfl: 0 . aspirin 81 MG EC tablet *ANTIPLATELET*, Take by mouth daily., Disp: , Rfl:  . atorvastatin (LIPITOR) 20 MG tablet, Take 20 mg by mouth daily., Disp: , Rfl:  . diltiazem (CARTIA XT, CARDIZEM CD) 180 MG 24 hr capsule, Take 180 mg by mouth daily., Disp: , Rfl:  . doxazosin (CARDURA) 2 MG tablet, Take 2 mg by mouth nightly., Disp: , Rfl:  . methylPREDNISolone (MEDROL) 4 mg tablet, follow package directions, Disp: 21 tablet, Rfl: 0  A complete ROS was performed with pertinent positives/negatives noted in the HPI. The remainder of the ROS are negative.   Physical Exam:   There were no vitals taken for this visit.  General Awake, at baseline alertness during examination.  Eyes No scleral icterus or conjunctival hemorrhage. Globe position appears normal. EOMI.  Right Ear EAC patent, TM intact w/o inflammation. Middle ear well aerated.  Left Ear EAC patent, TM intact w/o inflammation. Middle ear well aerated.  Nose Copious mucopurulent drainage in right nasal vestibule. This was suctioned out. Middle turbinate appears slightly irregular and inflamed. Left nasal passage is patent. Septum relatively midline.  Oral cavity No mucosal  lesions or tumors seen. Tongue midline.  Oropharynx Symmetric tonsils.  Neck No abnormal cervical lymphadenopathy. No thyromegaly. No thyroid masses palpated.  Cardio-vascular No cyanosis.  Pulmonary No audible stridor. Breathing easily with no labor.  Neuro Symmetric facial movement.  Psychiatry Appropriate affect and mood for clinic visit.   Independent Review of Additional Tests or Records:   Medical records.   Procedures:  None  Impression & Plans:  [name] is a 74 y.o. male with two months of new right sided nasal obstruction and purulent rhinorrhea. This may represent a chronic infectious process versus polyp/mass. I have recommended a 10-day course of Augmentin 875mg  BID, medrol dose pack and follow up in two weeks for maxillofacial CT imaging.   Patient agrees with the plan.

## 2019-11-10 ENCOUNTER — Other Ambulatory Visit (HOSPITAL_COMMUNITY)
Admission: RE | Admit: 2019-11-10 | Discharge: 2019-11-10 | Disposition: A | Discharge status: Home / Self Care | Payer: Medicare HMO | Source: Ambulatory Visit | Attending: Otolaryngology | Admitting: Otolaryngology

## 2019-11-10 DIAGNOSIS — Z01818 Encounter for other preprocedural examination: Secondary | ICD-10-CM | POA: Insufficient documentation

## 2019-11-10 DIAGNOSIS — Z20822 Contact with and (suspected) exposure to covid-19: Secondary | ICD-10-CM | POA: Diagnosis not present

## 2019-11-10 LAB — SARS CORONAVIRUS 2 (TAT 6-24 HRS): SARS Coronavirus 2: NEGATIVE

## 2019-11-11 ENCOUNTER — Encounter (HOSPITAL_COMMUNITY): Payer: Self-pay | Admitting: Otolaryngology

## 2019-11-11 NOTE — Progress Notes (Signed)
PCP:  Milagros Evener, MD Cardiologist:  Dr. Virgina Jock and Dr. Lars Mage  EKG:  10/10/19 CXR:  N/A ECHO:  10/07/19 Stress Test:  Denies Cardiac Cath:  Denies ETT:  10/07/19  Covid test 11/10/19 Negative  Anesthesia Review:  Abnormal EKG.  Patient has seen Dr. Virgina Jock and Dr. Quentin Ore since then  Patient denies shortness of breath, fever, cough, and chest pain at PAT appointment.  Patient verbalized understanding of instructions provided today at the PAT appointment.  Patient asked to review instructions at home and day of surgery.

## 2019-11-11 NOTE — Progress Notes (Signed)
Anesthesia Chart Review: Philip Anderson   Case: [id number] Date/Time: 11/12/19 0935   Procedure: MAXILLARY ANTROSTOMY (Right )   Anesthesia type: General   Pre-op diagnosis: chronic maxillary sinusitis   Location: [facility] OR ROOM 08 / [facility]   Surgeons: [name], MD      DISCUSSION: Patient is a 74 year old male scheduled for the above procedure.   History includes never smoker, HTN, hypercholesterolemia, 2nd degree AV block (Mobitz 1).  Surgery was initially scheduled for August 2021, but 09/16/19 EKG showed showed "third-degree block with junctional escape beats and occasional PVC." Patient was completely asymptomatic, and in fact, was quite active walking up to 2 miles several days a week. He was evaluated by cardiologist Dr. Virgina Jock who did not suspect ischemic etiology of his sinus/AV node dysfunction. Doxazosin was at least temporarily stopped to see if it would improve his sinus/AV nodal function. He was referred to EP cardiologist Dr. Quentin Ore, and also underwent an echocardiogram and ETT. His echo showed normal LVF with no significant valvular disease. He was able to achieve 85% (peah HR 125 bpm) of his maximum predicted HR for 7 METS, no ST changes, with ECG showing SR with first degree AV block, PVCs and couplets with  transient Mobitz 1 second-degree AV block in recovery.  Regarding pre-operative EP input, Dr. Quentin Ore wrote:  "Second degree (Mobitz I) AV block with a competing junctional escape rhythm Good chronotropic competence on treadmill ECG test. His AV conduction improved with exertion which is typical for block at the sinus node level (Mobitz I). His response to exercise would NOT be typical for intrahisian or infrahisian block.  For this reason, I do not think he is at a particularly increased risk of developing complete heart block during anaesthesia. I would recommend he be on a heart monitor during any procedure involving sedation. I suspect his heart rates at rest (during  anaesthesia) to be in the 40s.  Will plan on seeing him on an annual basis with ECG in clinic to confirm no progression of conduction abnormalities."  Preoperative COVID-19 test negative on 11/10/2019.  Anesthesia team to evaluate on the day of surgery.   VS:  Wt Readings from Last 3 Encounters:  10/10/19 82.2 kg  09/24/19 85.7 kg  09/18/19 83.5 kg   BP Readings from Last 3 Encounters:  10/10/19 (!) 164/82  09/24/19 (!) 144/88  09/18/19 (!) 141/72   Pulse Readings from Last 3 Encounters:  10/10/19 (!) 46  09/24/19 (!) 54  09/18/19 61    PROVIDERS: Rankins, Bill Salinas, MD is PCP Lars Mage, MD is EP cardiologist Vernell Leep, MD is cardiologist   LABS: For day of procedure. As of 09/16/19, Cr 1.16, glucose 120, TSH 0.897.   IMAGES: CT Paranasal Sinuses 08/27/19 [facility]): IMPRESSION:  1. Completely opacified right maxillary sinus with thinning or  dehiscence of bone along both the posterior and superior walls,  possible abnormal soft tissue in the right retro maxillary fat.  2. Suspected expansion of the right OMC and large, approximately 3.6  cm polypoid mass within the right nasal cavity which is subtotally  opacified.  3. Associated right OMC pattern of obstructive sinus disease  otherwise.  4. Left paranasal sinuses and left nasal cavity are well  pneumatized. Note hyperplastic sphenoid sinuses.  5. Top differential considerations include right maxillary mucocele,  antrochoanal polyp, inverted papilloma.      EKG: 10/10/19 ([facility]): Second-degree (Mobitz 1) with competing junctional rhythm.   CV: Echo 10/07/19: IMPRESSIONS  1. Rhythm appears to be sinus rhythm with complete heart block and  junctional escape.  2. Left ventricular ejection fraction, by estimation, is 60 to 65%. Left  ventricular ejection fraction by 3D volume is 64 %. The left ventricle has  normal function. The left ventricle has no regional wall motion  abnormalities.  Indeterminate diastolic  filling due to E-A fusion.  3. Right ventricular systolic function is normal. The right ventricular  size is normal. There is mildly elevated pulmonary artery systolic  pressure. The estimated right ventricular systolic pressure is 00.9 mmHg.  4. The mitral valve is grossly normal. Mild mitral valve regurgitation.  No evidence of mitral stenosis.  5. The aortic valve is tricuspid. There is mild calcification of the  aortic valve. Aortic valve regurgitation is not visualized. Mild aortic  valve sclerosis is present, with no evidence of aortic valve stenosis.  6. The inferior vena cava is normal in size with greater than 50%  respiratory variability, suggesting right atrial pressure of 3 mmHg.   ETT 10/07/19:  Blood pressure demonstrated a normal response to exercise.  There was no ST segment deviation noted during stress.  Exercise tolerance test with mildly impaired exercise tolerance (4:15); no chest pain, normal blood pressure response, no ST changes, transient Mobitz 1 second-degree AV block in recovery; negative adequate exercise tolerance test; Duke treadmill score 4.    Past Medical History:  Diagnosis Date  . High cholesterol   . Hypertension   . Second degree AV block, Mobitz type I    EP - Dr. Quentin Ore    Past Surgical History:  Procedure Laterality Date  . COLONOSCOPY      MEDICATIONS: No current facility-administered medications for this encounter.   Marland Kitchen acetaminophen (TYLENOL) 500 MG tablet  . allopurinol (ZYLOPRIM) 100 MG tablet  . Alpha-Lipoic Acid 600 MG CAPS  . APPLE CIDER VINEGAR PO  . atorvastatin (LIPITOR) 20 MG tablet  . Coenzyme Q10 (COQ10 PO)  . doxazosin (CARDURA) 2 MG tablet  . lisinopril-hydrochlorothiazide (ZESTORETIC) 10-12.5 MG tablet  . magnesium oxide (MAG-OX) 400 MG tablet  . Multiple Vitamin (MULTIVITAMIN WITH MINERALS) TABS tablet  . oxymetazoline (AFRIN) 0.05 % nasal spray  . TIADYLT ER 180 MG 24 hr capsule   . aspirin EC 81 MG tablet   ASA on hold for surgery.     Myra Gianotti, PA-C Surgical Short Stay/Anesthesiology [facility] Phone [phone] [facility] Phone [phone] 11/11/2019 1:29 PM

## 2019-11-11 NOTE — Anesthesia Preprocedure Evaluation (Addendum)
Anesthesia Evaluation  Patient identified by MRN, date of birth, ID band Patient awake    Reviewed: Allergy & Precautions, H&P , NPO status , Patient's Chart, lab work & pertinent test results  Airway Mallampati: II   Neck ROM: full    Dental   Pulmonary neg pulmonary ROS,    breath sounds clear to auscultation       Cardiovascular hypertension, + dysrhythmias  Rhythm:regular Rate:Normal  Pt with 2nd degree heart block.  Workup done by cardiology (Dr Quentin Ore) who felt no need for pacemaker at this time.  Pt tolerates low HR well.   Neuro/Psych    GI/Hepatic   Endo/Other    Renal/GU      Musculoskeletal   Abdominal   Peds  Hematology   Anesthesia Other Findings   Reproductive/Obstetrics                            Anesthesia Physical Anesthesia Plan  ASA: II  Anesthesia Plan: General   Post-op Pain Management:    Induction: Intravenous  PONV Risk Score and Plan: 2 and Ondansetron, Dexamethasone, Midazolam and Treatment may vary due to age or medical condition  Airway Management Planned: Oral ETT  Additional Equipment:   Intra-op Plan:   Post-operative Plan: Extubation in OR  Informed Consent: I have reviewed the patients History and Physical, chart, labs and discussed the procedure including the risks, benefits and alternatives for the proposed anesthesia with the patient or authorized representative who has indicated his/her understanding and acceptance.       Plan Discussed with: CRNA, Anesthesiologist and Surgeon  Anesthesia Plan Comments: (PAT note written 11/11/2019 by Myra Gianotti, PA-C.  Surgery was initially scheduled for August 2021, but 09/16/19 EKG showed showed "third-degree block with junctional escape beats and occasional PVC." Patient was completely asymptomatic, and in fact, was quite active walking up to 2 miles several days a week. He was evaluated by  cardiologist Dr. Virgina Jock who did not suspect ischemic etiology of his sinus/AV node dysfunction. Doxazosin stopped to see if it would improve his sinus/AV nodal function. He was referred to EP cardiologist Dr. Quentin Ore, and also underwent an echocardiogram and ETT. His echo showed normal LVF with no significant valvular disease. He was able to achieve 85% (peah HR 125 bpm) of his maximum predicted HR for 7 METS, no ST changes, with ECG showing SR with first degree AV block, PVCs and couplets with  transient Mobitz 1 second-degree AV block in recovery.  Regarding pre-operative EP input, Dr. Quentin Ore wrote:  "Second degree (Mobitz I) AV block with a competing junctional escape rhythm Good chronotropic competence on treadmill ECG test. His AV conduction improved with exertion which is typical for block at the sinus node level (Mobitz I). His response to exercise would NOT be typical for intrahisian or infrahisian block.  For this reason, I do not think he is at a particularly increased risk of developing complete heart block during anaesthesia. I would recommend he be on a heart monitor during any procedure involving sedation. I suspect his heart rates at rest (during anaesthesia) to be in the 40s.  Will plan on seeing him on an annual basis with ECG in clinic to confirm no progression of conduction abnormalities.")       Anesthesia Quick Evaluation

## 2019-11-12 ENCOUNTER — Encounter (HOSPITAL_COMMUNITY)
Admission: RE | Disposition: A | Discharge status: Home / Self Care | Payer: Self-pay | Source: Home / Self Care | Attending: Otolaryngology

## 2019-11-12 ENCOUNTER — Other Ambulatory Visit: Payer: Self-pay

## 2019-11-12 ENCOUNTER — Ambulatory Visit (HOSPITAL_COMMUNITY): Payer: Medicare HMO | Admitting: Vascular Surgery

## 2019-11-12 ENCOUNTER — Ambulatory Visit (HOSPITAL_COMMUNITY)
Admission: RE | Admit: 2019-11-12 | Discharge: 2019-11-12 | Disposition: A | Discharge status: Home / Self Care | Payer: Medicare HMO | Attending: Otolaryngology | Admitting: Otolaryngology

## 2019-11-12 ENCOUNTER — Encounter (HOSPITAL_COMMUNITY): Payer: Self-pay | Admitting: Otolaryngology

## 2019-11-12 DIAGNOSIS — I442 Atrioventricular block, complete: Secondary | ICD-10-CM | POA: Diagnosis not present

## 2019-11-12 DIAGNOSIS — C310 Malignant neoplasm of maxillary sinus: Secondary | ICD-10-CM | POA: Insufficient documentation

## 2019-11-12 DIAGNOSIS — I10 Essential (primary) hypertension: Secondary | ICD-10-CM | POA: Insufficient documentation

## 2019-11-12 DIAGNOSIS — E7800 Pure hypercholesterolemia, unspecified: Secondary | ICD-10-CM | POA: Insufficient documentation

## 2019-11-12 DIAGNOSIS — Z79899 Other long term (current) drug therapy: Secondary | ICD-10-CM | POA: Insufficient documentation

## 2019-11-12 DIAGNOSIS — J338 Other polyp of sinus: Secondary | ICD-10-CM | POA: Diagnosis not present

## 2019-11-12 DIAGNOSIS — Z7982 Long term (current) use of aspirin: Secondary | ICD-10-CM | POA: Insufficient documentation

## 2019-11-12 DIAGNOSIS — J322 Chronic ethmoidal sinusitis: Secondary | ICD-10-CM | POA: Diagnosis not present

## 2019-11-12 DIAGNOSIS — J320 Chronic maxillary sinusitis: Secondary | ICD-10-CM | POA: Diagnosis not present

## 2019-11-12 DIAGNOSIS — J330 Polyp of nasal cavity: Secondary | ICD-10-CM | POA: Diagnosis not present

## 2019-11-12 DIAGNOSIS — C319 Malignant neoplasm of accessory sinus, unspecified: Secondary | ICD-10-CM | POA: Diagnosis not present

## 2019-11-12 HISTORY — PX: MAXILLARY ANTROSTOMY: SHX2003

## 2019-11-12 HISTORY — DX: Atrioventricular block, second degree: I44.1

## 2019-11-12 LAB — BASIC METABOLIC PANEL
Anion gap: 12 (ref 5–15)
BUN: 11 mg/dL (ref 8–23)
CO2: 22 mmol/L (ref 22–32)
Calcium: 9.2 mg/dL (ref 8.9–10.3)
Chloride: 104 mmol/L (ref 98–111)
Creatinine, Ser: 0.99 mg/dL (ref 0.61–1.24)
GFR, Estimated: >60 mL/min (ref >60)
Glucose, Bld: 111 mg/dL — ABNORMAL HIGH (ref 70–99)
Potassium: 3.7 mmol/L (ref 3.5–5.1)
Sodium: 138 mmol/L (ref 135–145)

## 2019-11-12 LAB — CBC
HCT: 42.7 % (ref 39.0–52.0)
Hemoglobin: 14.3 g/dL (ref 13.0–17.0)
MCH: 30.5 pg (ref 26.0–34.0)
MCHC: 33.5 g/dL (ref 30.0–36.0)
MCV: 91 fL (ref 80.0–100.0)
Platelets: 295 10*3/uL (ref 150–400)
RBC: 4.69 MIL/uL (ref 4.22–5.81)
RDW: 12.4 % (ref 11.5–15.5)
WBC: 7.2 10*3/uL (ref 4.0–10.5)
nRBC: 0 % (ref 0.0–0.2)

## 2019-11-12 SURGERY — MAXILLARY ANTROSTOMY
Anesthesia: General | Site: Nose | Laterality: Right

## 2019-11-12 MED ORDER — ORAL CARE MOUTH RINSE: 15.0000 mL | Freq: Once | OROMUCOSAL | Status: AC

## 2019-11-12 MED ORDER — OXYMETAZOLINE HCL 0.05 % NA SOLN
NASAL | Status: DC | PRN
  Administered 2019-11-12: 1 via TOPICAL

## 2019-11-12 MED ORDER — FENTANYL CITRATE (PF) 100 MCG/2ML IJ SOLN: 25.0000 ug | INTRAMUSCULAR | Status: DC | PRN

## 2019-11-12 MED ORDER — LIDOCAINE 2% (20 MG/ML) 5 ML SYRINGE
INTRAMUSCULAR | Status: DC | PRN
  Administered 2019-11-12: 50 mg via INTRAVENOUS

## 2019-11-12 MED ORDER — PROPOFOL 10 MG/ML IV BOLUS
INTRAVENOUS | Status: DC | PRN
  Administered 2019-11-12: 150 mg via INTRAVENOUS

## 2019-11-12 MED ORDER — OXYCODONE HCL 5 MG PO TABS: 5.0000 mg | ORAL_TABLET | Freq: Once | ORAL | Status: DC | PRN

## 2019-11-12 MED ORDER — OXYMETAZOLINE HCL 0.05 % NA SOLN
NASAL | Status: AC
  Filled 2019-11-12: qty 30

## 2019-11-12 MED ORDER — SODIUM CHLORIDE 0.9 % IR SOLN
Status: DC | PRN
  Administered 2019-11-12: 1000 mL

## 2019-11-12 MED ORDER — LACTATED RINGERS IV SOLN: INTRAVENOUS | Status: DC

## 2019-11-12 MED ORDER — LIDOCAINE-EPINEPHRINE 1 %-1:100000 IJ SOLN
INTRAMUSCULAR | Status: AC
  Filled 2019-11-12: qty 1

## 2019-11-12 MED ORDER — LIDOCAINE 2% (20 MG/ML) 5 ML SYRINGE
INTRAMUSCULAR | Status: AC
  Filled 2019-11-12: qty 5

## 2019-11-12 MED ORDER — ROCURONIUM BROMIDE 10 MG/ML (PF) SYRINGE
PREFILLED_SYRINGE | INTRAVENOUS | Status: DC | PRN
  Administered 2019-11-12: 60 mg via INTRAVENOUS

## 2019-11-12 MED ORDER — BACITRACIN ZINC 500 UNIT/GM EX OINT
TOPICAL_OINTMENT | CUTANEOUS | Status: AC
  Filled 2019-11-12: qty 28.35

## 2019-11-12 MED ORDER — ONDANSETRON HCL 4 MG/2ML IJ SOLN
INTRAMUSCULAR | Status: DC | PRN
  Administered 2019-11-12: 4 mg via INTRAVENOUS

## 2019-11-12 MED ORDER — DEXMEDETOMIDINE (PRECEDEX) IN NS 20 MCG/5ML (4 MCG/ML) IV SYRINGE
PREFILLED_SYRINGE | INTRAVENOUS | Status: DC | PRN
  Administered 2019-11-12 (×2): 4 ug via INTRAVENOUS

## 2019-11-12 MED ORDER — CHLORHEXIDINE GLUCONATE 0.12 % MT SOLN
15.0000 mL | Freq: Once | OROMUCOSAL | Status: AC
  Administered 2019-11-12: 15 mL via OROMUCOSAL
  Filled 2019-11-12: qty 15

## 2019-11-12 MED ORDER — BACITRACIN ZINC 500 UNIT/GM EX OINT
TOPICAL_OINTMENT | CUTANEOUS | Status: DC | PRN
  Administered 2019-11-12: 1 via TOPICAL

## 2019-11-12 MED ORDER — DEXMEDETOMIDINE (PRECEDEX) IN NS 20 MCG/5ML (4 MCG/ML) IV SYRINGE
PREFILLED_SYRINGE | INTRAVENOUS | Status: AC
  Filled 2019-11-12: qty 15

## 2019-11-12 MED ORDER — ONDANSETRON HCL 4 MG/2ML IJ SOLN
INTRAMUSCULAR | Status: AC
  Filled 2019-11-12: qty 2

## 2019-11-12 MED ORDER — OXYMETAZOLINE HCL 0.05 % NA SOLN
2.0000 | NASAL | Status: DC
  Administered 2019-11-12: 1 via NASAL
  Filled 2019-11-12: qty 30

## 2019-11-12 MED ORDER — LIDOCAINE-EPINEPHRINE 1 %-1:100000 IJ SOLN
INTRAMUSCULAR | Status: DC | PRN
  Administered 2019-11-12: 5 mL

## 2019-11-12 MED ORDER — SUGAMMADEX SODIUM 200 MG/2ML IV SOLN
INTRAVENOUS | Status: DC | PRN
  Administered 2019-11-12: 200 mg via INTRAVENOUS

## 2019-11-12 MED ORDER — FENTANYL CITRATE (PF) 250 MCG/5ML IJ SOLN
INTRAMUSCULAR | Status: DC | PRN
Start: 2019-11-12 — End: 2019-11-12
  Administered 2019-11-12: 100 ug via INTRAVENOUS
  Administered 2019-11-12: 50 ug via INTRAVENOUS

## 2019-11-12 MED ORDER — PROPOFOL 10 MG/ML IV BOLUS
INTRAVENOUS | Status: AC
  Filled 2019-11-12: qty 20

## 2019-11-12 MED ORDER — HYDROCODONE-ACETAMINOPHEN 7.5-325 MG PO TABS
1.0000 | ORAL_TABLET | Freq: Four times a day (QID) | ORAL | 0 refills | Status: DC | PRN
Start: 2019-11-12 — End: 2021-09-01

## 2019-11-12 MED ORDER — FENTANYL CITRATE (PF) 250 MCG/5ML IJ SOLN
INTRAMUSCULAR | Status: AC
  Filled 2019-11-12: qty 5

## 2019-11-12 MED ORDER — OXYCODONE HCL 5 MG/5ML PO SOLN: 5.0000 mg | Freq: Once | ORAL | Status: DC | PRN

## 2019-11-12 MED ORDER — 0.9 % SODIUM CHLORIDE (POUR BTL) OPTIME
TOPICAL | Status: DC | PRN
  Administered 2019-11-12: 1000 mL

## 2019-11-12 MED ORDER — ARTIFICIAL TEARS OPHTHALMIC OINT
TOPICAL_OINTMENT | OPHTHALMIC | Status: DC | PRN
  Administered 2019-11-12: 1 via OPHTHALMIC

## 2019-11-12 MED ORDER — ONDANSETRON HCL 4 MG/2ML IJ SOLN: 4.0000 mg | Freq: Four times a day (QID) | INTRAMUSCULAR | Status: DC | PRN

## 2019-11-12 MED ORDER — ONDANSETRON 8 MG PO TBDP: 8.0000 mg | ORAL_TABLET | Freq: Three times a day (TID) | ORAL | 1 refills | Status: DC | PRN

## 2019-11-12 MED ORDER — CEPHALEXIN 500 MG PO CAPS: 500.0000 mg | ORAL_CAPSULE | Freq: Three times a day (TID) | ORAL | 0 refills | Status: DC

## 2019-11-12 MED ORDER — ROCURONIUM BROMIDE 10 MG/ML (PF) SYRINGE
PREFILLED_SYRINGE | INTRAVENOUS | Status: AC
  Filled 2019-11-12: qty 10

## 2019-11-12 MED ORDER — DEXAMETHASONE SODIUM PHOSPHATE 10 MG/ML IJ SOLN
INTRAMUSCULAR | Status: DC | PRN
  Administered 2019-11-12: 4 mg via INTRAVENOUS

## 2019-11-12 SURGICAL SUPPLY — 37 items
ATTRACTOMAT 16X20 MAGNETIC DRP (DRAPES) IMPLANT
BLADE RAD40 ROTATE 4M 4 5PK (BLADE) IMPLANT
BLADE RAD40 ROTATE 4M 4MM 5PK (BLADE)
BLADE RAD60 ROTATE M4 4 5PK (BLADE) ×2 IMPLANT
BLADE RAD60 ROTATE M4 4MM 5PK (BLADE) ×1
BLADE SURG 15 STRL LF DISP TIS (BLADE) IMPLANT
BLADE SURG 15 STRL SS (BLADE)
BLADE TRICUT ROTATE M4 4 5PK (BLADE) ×2 IMPLANT
BLADE TRICUT ROTATE M4 4MM 5PK (BLADE) ×1
CANISTER SUCT 3000ML PPV (MISCELLANEOUS) ×3 IMPLANT
COVER WAND RF STERILE (DRAPES) ×3 IMPLANT
DRAPE HALF SHEET 40X57 (DRAPES) IMPLANT
DRESSING NASAL KENNEDY 3.5X.9 (MISCELLANEOUS) IMPLANT
DRSG NASAL KENNEDY 3.5X.9 (MISCELLANEOUS)
DRSG NASOPORE 8CM (GAUZE/BANDAGES/DRESSINGS) ×3 IMPLANT
ELECT REM PT RETURN 9FT ADLT (ELECTROSURGICAL)
ELECTRODE REM PT RTRN 9FT ADLT (ELECTROSURGICAL) IMPLANT
GAUZE SPONGE 4X4 12PLY STRL LF (GAUZE/BANDAGES/DRESSINGS) ×3 IMPLANT
GLOVE ECLIPSE 7.5 STRL STRAW (GLOVE) ×6 IMPLANT
GOWN STRL REUS W/ TWL LRG LVL3 (GOWN DISPOSABLE) ×2 IMPLANT
GOWN STRL REUS W/TWL LRG LVL3 (GOWN DISPOSABLE) ×4
KIT BASIN OR (CUSTOM PROCEDURE TRAY) ×3 IMPLANT
KIT TURNOVER KIT B (KITS) ×3 IMPLANT
NEEDLE PRECISIONGLIDE 27X1.5 (NEEDLE) ×3 IMPLANT
NS IRRIG 1000ML POUR BTL (IV SOLUTION) ×3 IMPLANT
PAD ARMBOARD 7.5X6 YLW CONV (MISCELLANEOUS) ×6 IMPLANT
PATTIES SURGICAL .5 X3 (DISPOSABLE) ×6 IMPLANT
SHEATH ENDOSCRUB 0 DEG (SHEATH) IMPLANT
SHEATH ENDOSCRUB 30 DEG (SHEATH) IMPLANT
SPECIMEN JAR SMALL (MISCELLANEOUS) ×3 IMPLANT
SWAB COLLECTION DEVICE MRSA (MISCELLANEOUS) IMPLANT
SWAB CULTURE ESWAB REG 1ML (MISCELLANEOUS) IMPLANT
SYR 50ML SLIP (SYRINGE) IMPLANT
TAPE CLOTH 1X10 TAN NS (GAUZE/BANDAGES/DRESSINGS) ×3 IMPLANT
TOWEL GREEN STERILE FF (TOWEL DISPOSABLE) ×3 IMPLANT
TRAY ENT MC OR (CUSTOM PROCEDURE TRAY) ×3 IMPLANT
WATER STERILE IRR 1000ML POUR (IV SOLUTION) ×3 IMPLANT

## 2019-11-12 NOTE — Discharge Instructions (Signed)
Start using saline spray every hour while awake.  May start resuming nasal irrigation on Friday.

## 2019-11-12 NOTE — Op Note (Signed)
OPERATIVE REPORT  DATE OF SURGERY: 11/12/2019  PATIENT:  Philip Anderson,  74 y.o. 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 Following induction of general endotracheal anesthesia, the face was draped in a standard fashion.  Oxymetazoline spray was used preoperatively in the nasal cavities.  1% Xylocaine with epinephrine was infiltrated into the right nasal polypoid mass and into the superior and anterior attachments of the middle turbinate and lateral nasal wall.  Afrin-soaked pledgets were used periodically throughout the case for hemostasis.  1.  Right endoscopic nasal polypectomy.  Using a 0 degree endoscope and a microdebrider the large polypoid mass that was filling the right nasal cavity was debrided all the way back towards the infundibular region.  Posterior nasal cavity was also cleaned of disease.  Polypoid disease was dissected up and towards the superior meatus.  2.  Right endoscopic total ethmoidectomy.  The infundibulum was inspected and polypoid disease was dissected using the microdebrider.  This continued into the bulla in the ethmoid cells.  There is inflammatory disease within the anterior ethmoid cells.  The ground lamella was taken down to expose the posterior ethmoid cells as well.  A complete ethmoid dissection was accomplished up to the fovea and laterally  to the lamina papyracea.  The middle turbinate was otherwise kept intact.  At the end of the case a half of a nasal pore dressing was placed into the right ethmoid cavity.  3.  Right endoscopic maxillary antrostomy with removal of tissue.  Using a 30 degree scope and curved suction the right maxillary antrum was entered.  Position was completely filled with polypoid disease area had the appearance of possible inverting papilloma.  All pieces were sent for pathologic evaluation.  Large amount of polypoid disease was cleaned out of the maxillary antrum using angled forceps.  The antrostomy was enlarged anteriorly using backbiting forceps.  At the end of the case the other half of the nasal pore dressing was placed into the maxillary antrum.  Afrin-soaked pledgets were placed in the posterior nasal cavity and removed after extubation.  The pharynx was suctioned blood and secretions.  Patient was awakened extubated and transferred to recovery in stable condition.    PATIENT DISPOSITION:  To PACU, stable

## 2019-11-12 NOTE — Anesthesia Procedure Notes (Signed)
Procedure Name: Intubation Performed by: Milford Cage, CRNA Pre-anesthesia Checklist: Patient identified, Emergency Drugs available, Suction available and Patient being monitored Patient Re-evaluated:Patient Re-evaluated prior to induction Oxygen Delivery Method: Circle System Utilized Preoxygenation: Pre-oxygenation with 100% oxygen Induction Type: IV induction Ventilation: Mask ventilation without difficulty Laryngoscope Size: Miller and 2 Grade View: Grade I Tube type: Oral Number of attempts: 1 Airway Equipment and Method: Stylet and Oral airway Placement Confirmation: ETT inserted through vocal cords under direct vision,  positive ETCO2 and breath sounds checked- equal and bilateral Secured at: 23 cm Tube secured with: Tape Dental Injury: Teeth and Oropharynx as per pre-operative assessment

## 2019-11-12 NOTE — Interval H&P Note (Signed)
History and Physical Interval Note:  11/12/2019 9:29 AM  Philip Anderson  has presented today for surgery, with the diagnosis of chronic maxillary sinusitis.  The various methods of treatment have been discussed with the patient and family. After consideration of risks, benefits and other options for treatment, the patient has consented to  Procedure(s): MAXILLARY ANTROSTOMY (Right) as a surgical intervention.  The patient's history has been reviewed, patient examined, no change in status, stable for surgery.  I have reviewed the patient's chart and labs.  Questions were answered to the patient's satisfaction.     [name]

## 2019-11-12 NOTE — Transfer of Care (Signed)
Immediate Anesthesia Transfer of Care Note  Patient: Philip Anderson  Procedure(s) Performed: RIGHT ENDOSCOPIC MAXILLARY ANTROSTOMY WITH REMOVAL OF TISSUE (Right Nose)  Patient Location: PACU  Anesthesia Type:General  Level of Consciousness: awake  Airway & Oxygen Therapy: Patient Spontanous Breathing  Post-op Assessment: Report given to RN and Post -op Vital signs reviewed and stable  Post vital signs: Reviewed and stable  Last Vitals:  Vitals Value Taken Time  BP 130/93 11/12/19 1122  Temp    Pulse 51 11/12/19 1122  Resp 12 11/12/19 1122  SpO2 94 % 11/12/19 1122  Vitals shown include unvalidated device data.  Last Pain:  Vitals:   11/12/19 0825  TempSrc:   PainSc: 0-No pain         Complications: No complications documented.

## 2019-11-13 ENCOUNTER — Encounter (HOSPITAL_COMMUNITY): Payer: Self-pay | Admitting: Otolaryngology

## 2019-11-14 DIAGNOSIS — E7800 Pure hypercholesterolemia, unspecified: Secondary | ICD-10-CM | POA: Diagnosis not present

## 2019-11-14 DIAGNOSIS — I10 Essential (primary) hypertension: Secondary | ICD-10-CM | POA: Diagnosis not present

## 2019-11-14 LAB — SURGICAL PATHOLOGY

## 2019-11-14 NOTE — Anesthesia Postprocedure Evaluation (Signed)
Anesthesia Post Note  Patient: Philip Anderson  Procedure(s) Performed: RIGHT ENDOSCOPIC MAXILLARY ANTROSTOMY WITH REMOVAL OF TISSUE (Right Nose)     Patient location during evaluation: PACU Anesthesia Type: General Level of consciousness: awake and alert Pain management: pain level controlled Vital Signs Assessment: post-procedure vital signs reviewed and stable Respiratory status: spontaneous breathing, nonlabored ventilation, respiratory function stable and patient connected to nasal cannula oxygen Cardiovascular status: blood pressure returned to baseline and stable Postop Assessment: no apparent nausea or vomiting Anesthetic complications: no   No complications documented.  Last Vitals:  Vitals:   11/12/19 1200 11/12/19 1205  BP: (!) 124/59 119/61  Pulse: 80 (!) 40  Resp: 16 11  Temp:    SpO2: 95% 97%    Last Pain:  Vitals:   11/12/19 1200  TempSrc:   PainSc: 0-No pain                 Malayia Spizzirri S

## 2019-11-25 ENCOUNTER — Other Ambulatory Visit (HOSPITAL_COMMUNITY): Payer: Self-pay | Admitting: Otolaryngology

## 2019-11-25 DIAGNOSIS — C310 Malignant neoplasm of maxillary sinus: Secondary | ICD-10-CM

## 2019-12-01 DIAGNOSIS — C310 Malignant neoplasm of maxillary sinus: Secondary | ICD-10-CM | POA: Diagnosis not present

## 2019-12-02 DIAGNOSIS — C310 Malignant neoplasm of maxillary sinus: Secondary | ICD-10-CM | POA: Diagnosis not present

## 2019-12-09 ENCOUNTER — Other Ambulatory Visit: Payer: Self-pay

## 2019-12-09 ENCOUNTER — Ambulatory Visit (HOSPITAL_COMMUNITY)
Admission: RE | Admit: 2019-12-09 | Discharge: 2019-12-09 | Disposition: A | Discharge status: Home / Self Care | Payer: Medicare HMO | Source: Ambulatory Visit | Attending: Otolaryngology | Admitting: Otolaryngology

## 2019-12-09 DIAGNOSIS — I2510 Atherosclerotic heart disease of native coronary artery without angina pectoris: Secondary | ICD-10-CM | POA: Diagnosis not present

## 2019-12-09 DIAGNOSIS — C310 Malignant neoplasm of maxillary sinus: Secondary | ICD-10-CM | POA: Diagnosis not present

## 2019-12-09 DIAGNOSIS — K5730 Diverticulosis of large intestine without perforation or abscess without bleeding: Secondary | ICD-10-CM | POA: Insufficient documentation

## 2019-12-09 DIAGNOSIS — J320 Chronic maxillary sinusitis: Secondary | ICD-10-CM | POA: Diagnosis not present

## 2019-12-09 DIAGNOSIS — J3489 Other specified disorders of nose and nasal sinuses: Secondary | ICD-10-CM | POA: Diagnosis not present

## 2019-12-09 DIAGNOSIS — M461 Sacroiliitis, not elsewhere classified: Secondary | ICD-10-CM | POA: Insufficient documentation

## 2019-12-09 DIAGNOSIS — J341 Cyst and mucocele of nose and nasal sinus: Secondary | ICD-10-CM | POA: Diagnosis not present

## 2019-12-09 DIAGNOSIS — I700 Atherosclerosis of aorta: Secondary | ICD-10-CM | POA: Diagnosis not present

## 2019-12-09 LAB — GLUCOSE, CAPILLARY: Glucose-Capillary: 115 mg/dL — ABNORMAL HIGH (ref 70–99)

## 2019-12-09 IMAGING — CT NM PET TUM IMG INITIAL (PI) SKULL BASE T - THIGH
7 series · 25 of 25 positions shown · non-contrast
Comparison: CT scan [DATE]

CLINICAL DATA: Initial treatment strategy for maxillary sinus
squamous cell carcinoma.

EXAM:
NUCLEAR MEDICINE PET SKULL BASE TO THIGH
TECHNIQUE: 9.0 mCi F-18 FDG was injected intravenously. Full-ring PET imaging
was performed from the skull base to thigh after the radiotracer. CT
data was obtained and used for attenuation correction and anatomic
localization.
Fasting blood glucose: 115 mg/dl

[Series 3: pet hn_sk_thigh ac · axial · 5.0mm · 4.07mm/px · z∈[-990,-42]mm · 6 of 238 slices shown]
[im 1/238]
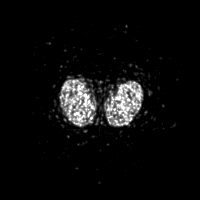
[im 48/238]
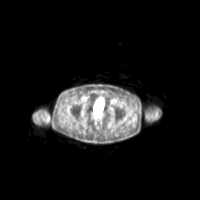
[im 95/238]
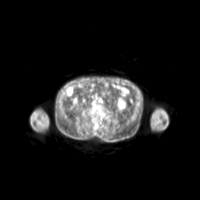
[im 143/238]
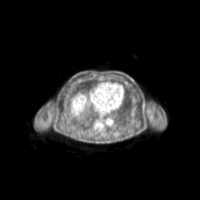
[im 190/238]
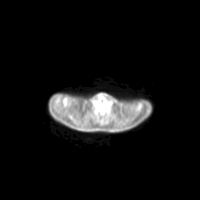
[im 238/238]
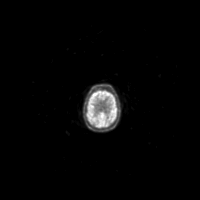

[Series 4: ct hn_sk_th 5.0 bf37 · axial · 5.0mm · 0.98mm/px · z∈[-990,-42]mm · 5 of 238 slices shown]
[im 1/238]
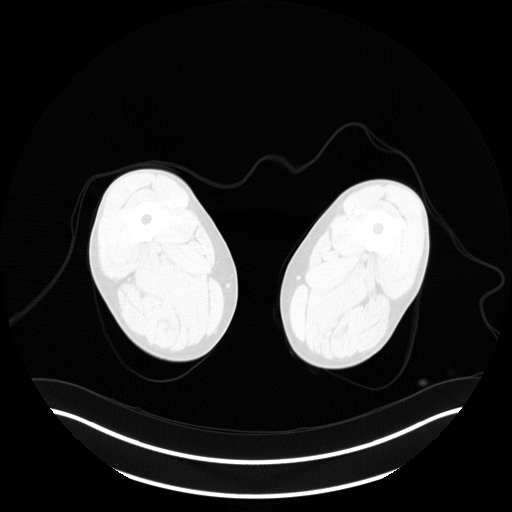
[im 60/238]
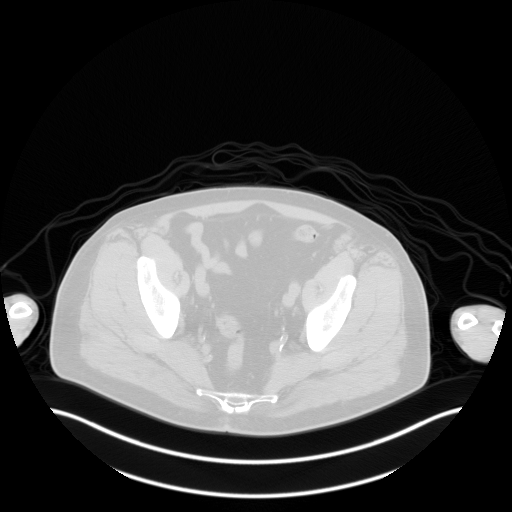
[im 119/238]
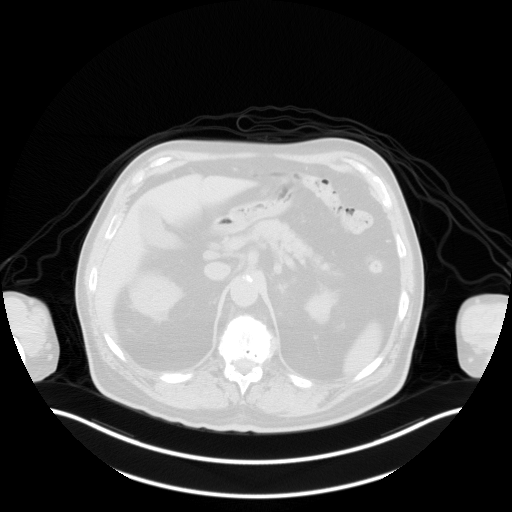
[im 178/238  brain]
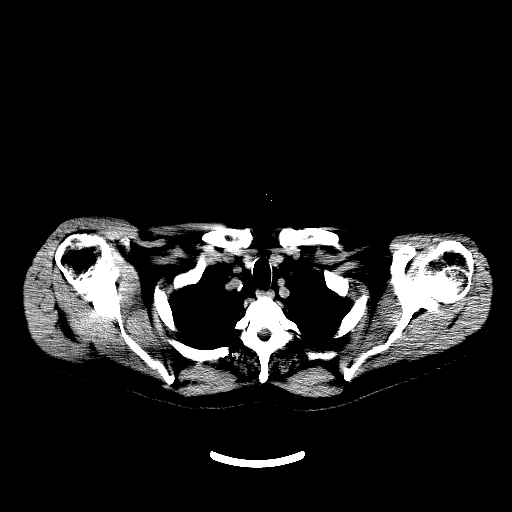
[im 238/238  brain]
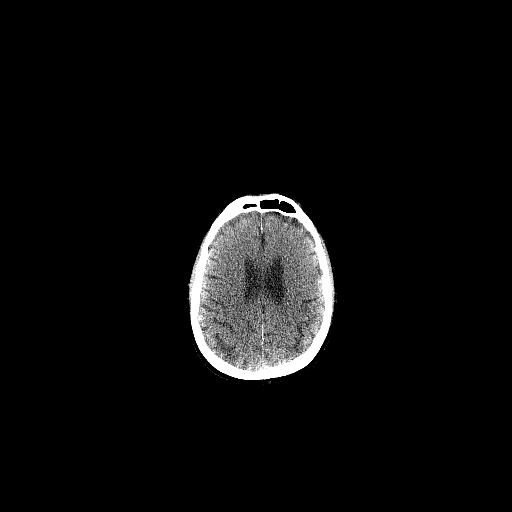

[Series 5: pet hn_sk_thigh nac · axial · 5.0mm · 4.07mm/px · z∈[-990,-42]mm · 5 of 238 slices shown]
[im 1/238]
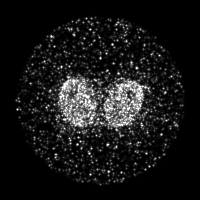
[im 60/238]
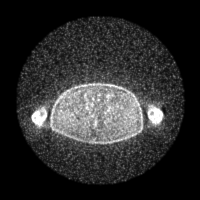
[im 119/238]
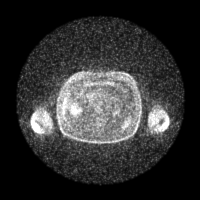
[im 178/238]
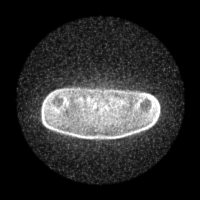
[im 238/238]
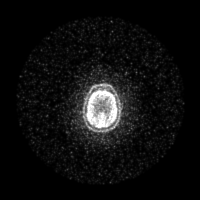

[Series 9: ct hn_sk_th 5.0 br59 (id)_bone · axial · 5.0mm · 0.74mm/px · z∈[-526,-234]mm · 2 of 74 slices shown]
[im 1/74]
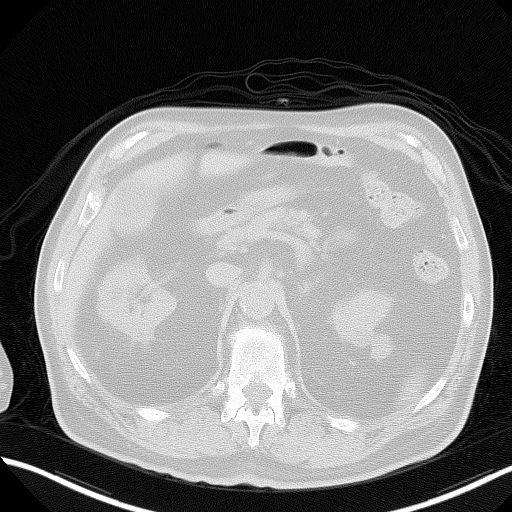
[im 74/74  brain]
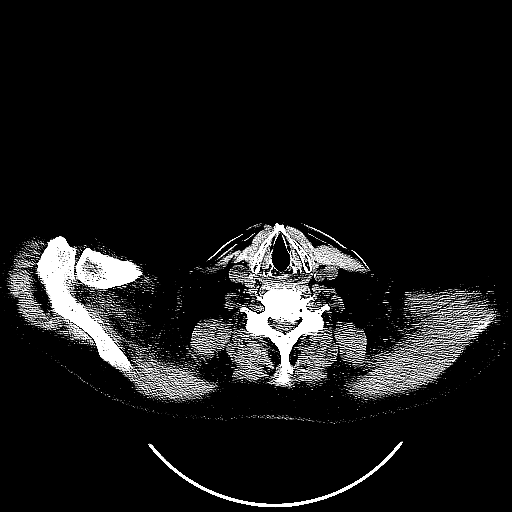

[Series 603: <mip collection> · coronal · 1.97mm/px · 1 of 32 slices shown]
[im 1/32]
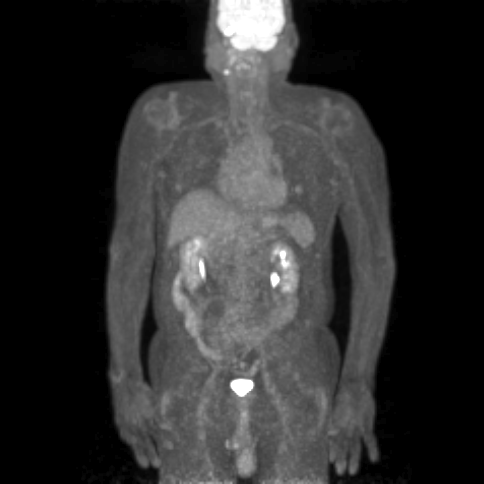

[Series 604: fused cor · 1 of 56 slices shown]
[im 1/56]
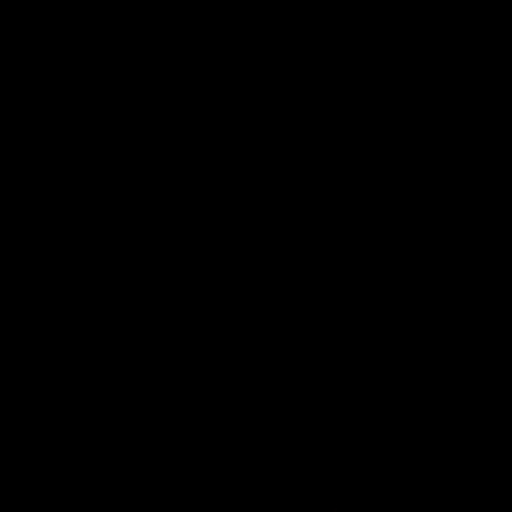

[Series 605: range-ct hn_sk_th 5.0 bf37-tra-<alpha range> · 5 of 229 slices shown]
[im 1/229]
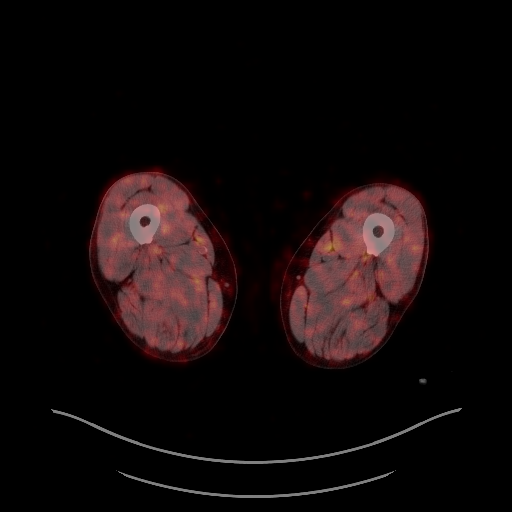
[im 58/229]
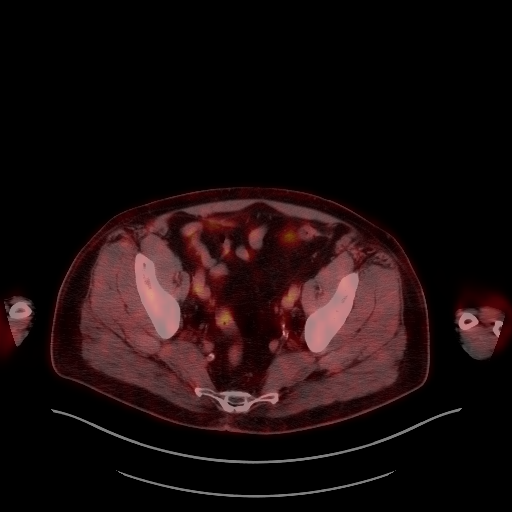
[im 115/229]
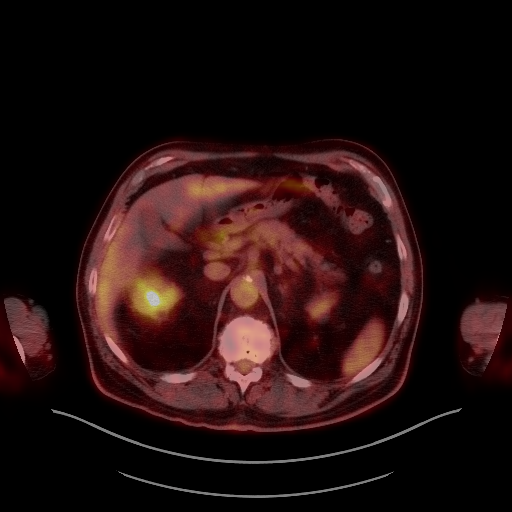
[im 172/229]
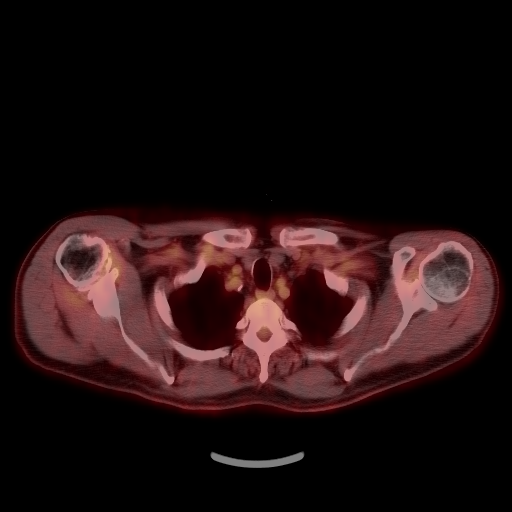
[im 229/229]
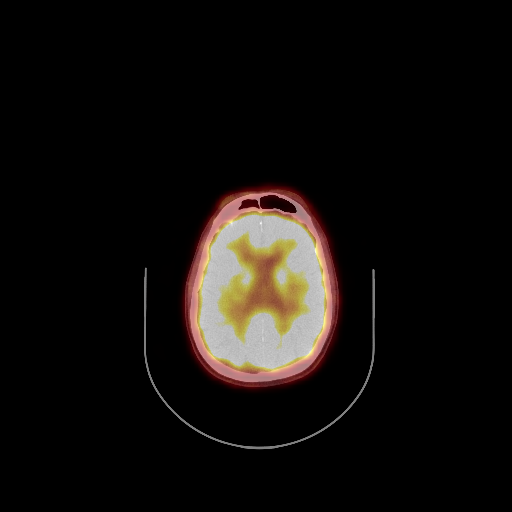

[25 of 25 positions shown; findings below may reference images not displayed]

FINDINGS: Mediastinal blood pool activity: SUV max

Liver activity: SUV max NA

NECK: There is mucosal thickening along the maxillary sinus with
removal or destruction of the medial wall the maxillary sinus, but
the masslike appearance in this vicinity shown on [DATE] is
substantially improved status post maxillary antrostomy,
ethmoidectomy, and polypectomy. Particularly along the superior and
lateral margin of the maxillary sinus, there is residual abnormal
hypermetabolic tissue with maximum SUV 20.7, favoring
residual/recurrent malignancy.

A right level IIa lymph node measuring 0.8 cm in short axis on image
36 of series 4 has maximum SUV of 8.7, compatible with malignant
involvement.

Incidental CT findings: Mucous retention cyst inferiorly in the left
maxillary sinus. Bilateral common carotid atherosclerotic
calcification.

CHEST: No significant abnormal hypermetabolic activity in this
region.

Incidental CT findings: Coronary, aortic arch, and branch vessel
atherosclerotic vascular disease.

ABDOMEN/PELVIS: No significant abnormal hypermetabolic activity in
this region.

Incidental CT findings: Aortoiliac atherosclerotic vascular disease.
Left kidney upper pole photopenic lesion posteriorly, probably a
cyst. Sigmoid colon diverticulosis.

SKELETON: No significant abnormal hypermetabolic activity in this
region.

Incidental CT findings: Grade 1 anterolisthesis of L4 on L5.
Sclerosis along the sacroiliac joints compatible with chronic
bilateral sacroiliitis.

Right anterior third rib deformity likely from old fracture, no
associated hypermetabolic activity.

Spondylosis noted.
IMPRESSION: 1. Residual hypermetabolic activity along the superior and lateral
portions of the right maxillary sinus, maximum SUV 20.7, favoring
residual/recurrent malignancy. Underlying mucosal thickening also in
the right maxillary sinus.
2. A right level IIa lymph node measuring 0.8 cm in short axis has a
maximum SUV of 8.7 compatible with malignant involvement.
3. Other imaging findings of potential clinical significance: Mucous
retention cyst in the left maxillary sinus. Aortic Atherosclerosis
([O0]-[O0]). Coronary atherosclerosis. Sigmoid colon
diverticulosis. Spondylosis. Chronic bilateral sacroiliitis.

## 2019-12-09 MED ORDER — FLUDEOXYGLUCOSE F - 18 (FDG) INJECTION
8.9700 | Freq: Once | INTRAVENOUS | Status: AC | PRN
  Administered 2019-12-09: 8.97 via INTRAVENOUS

## 2019-12-29 DIAGNOSIS — E7800 Pure hypercholesterolemia, unspecified: Secondary | ICD-10-CM | POA: Diagnosis not present

## 2019-12-29 DIAGNOSIS — I10 Essential (primary) hypertension: Secondary | ICD-10-CM | POA: Diagnosis not present

## 2019-12-31 DIAGNOSIS — C310 Malignant neoplasm of maxillary sinus: Secondary | ICD-10-CM | POA: Diagnosis not present

## 2020-01-01 DIAGNOSIS — C310 Malignant neoplasm of maxillary sinus: Secondary | ICD-10-CM | POA: Diagnosis not present

## 2020-01-02 DIAGNOSIS — C310 Malignant neoplasm of maxillary sinus: Secondary | ICD-10-CM | POA: Diagnosis not present

## 2020-01-07 DIAGNOSIS — C310 Malignant neoplasm of maxillary sinus: Secondary | ICD-10-CM | POA: Diagnosis not present

## 2020-01-07 DIAGNOSIS — I491 Atrial premature depolarization: Secondary | ICD-10-CM | POA: Diagnosis not present

## 2020-01-07 DIAGNOSIS — I441 Atrioventricular block, second degree: Secondary | ICD-10-CM | POA: Diagnosis not present

## 2020-01-07 DIAGNOSIS — E876 Hypokalemia: Secondary | ICD-10-CM | POA: Diagnosis not present

## 2020-01-07 DIAGNOSIS — N178 Other acute kidney failure: Secondary | ICD-10-CM | POA: Diagnosis not present

## 2020-01-07 DIAGNOSIS — E871 Hypo-osmolality and hyponatremia: Secondary | ICD-10-CM | POA: Diagnosis not present

## 2020-01-07 DIAGNOSIS — Z9221 Personal history of antineoplastic chemotherapy: Secondary | ICD-10-CM | POA: Diagnosis not present

## 2020-01-07 DIAGNOSIS — N179 Acute kidney failure, unspecified: Secondary | ICD-10-CM | POA: Diagnosis not present

## 2020-01-07 DIAGNOSIS — E790 Hyperuricemia without signs of inflammatory arthritis and tophaceous disease: Secondary | ICD-10-CM | POA: Diagnosis not present

## 2020-01-07 DIAGNOSIS — Z5111 Encounter for antineoplastic chemotherapy: Secondary | ICD-10-CM | POA: Diagnosis not present

## 2020-01-07 DIAGNOSIS — J690 Pneumonitis due to inhalation of food and vomit: Secondary | ICD-10-CM | POA: Diagnosis not present

## 2020-01-07 DIAGNOSIS — D61810 Antineoplastic chemotherapy induced pancytopenia: Secondary | ICD-10-CM | POA: Diagnosis not present

## 2020-01-07 DIAGNOSIS — Z95828 Presence of other vascular implants and grafts: Secondary | ICD-10-CM | POA: Diagnosis not present

## 2020-01-07 DIAGNOSIS — E785 Hyperlipidemia, unspecified: Secondary | ICD-10-CM | POA: Diagnosis not present

## 2020-01-07 DIAGNOSIS — I10 Essential (primary) hypertension: Secondary | ICD-10-CM | POA: Diagnosis not present

## 2020-01-07 DIAGNOSIS — M109 Gout, unspecified: Secondary | ICD-10-CM | POA: Diagnosis not present

## 2020-01-26 DIAGNOSIS — C310 Malignant neoplasm of maxillary sinus: Secondary | ICD-10-CM | POA: Diagnosis not present

## 2020-01-27 DIAGNOSIS — N179 Acute kidney failure, unspecified: Secondary | ICD-10-CM | POA: Diagnosis not present

## 2020-01-27 DIAGNOSIS — I10 Essential (primary) hypertension: Secondary | ICD-10-CM | POA: Diagnosis not present

## 2020-01-27 DIAGNOSIS — E875 Hyperkalemia: Secondary | ICD-10-CM | POA: Diagnosis not present

## 2020-01-27 DIAGNOSIS — C310 Malignant neoplasm of maxillary sinus: Secondary | ICD-10-CM | POA: Diagnosis not present

## 2020-01-27 DIAGNOSIS — D759 Disease of blood and blood-forming organs, unspecified: Secondary | ICD-10-CM | POA: Diagnosis not present

## 2020-01-28 DIAGNOSIS — E875 Hyperkalemia: Secondary | ICD-10-CM | POA: Diagnosis not present

## 2020-01-28 DIAGNOSIS — Z79899 Other long term (current) drug therapy: Secondary | ICD-10-CM | POA: Diagnosis not present

## 2020-01-28 DIAGNOSIS — C310 Malignant neoplasm of maxillary sinus: Secondary | ICD-10-CM | POA: Diagnosis not present

## 2020-01-28 DIAGNOSIS — N179 Acute kidney failure, unspecified: Secondary | ICD-10-CM | POA: Diagnosis not present

## 2020-01-28 DIAGNOSIS — I10 Essential (primary) hypertension: Secondary | ICD-10-CM | POA: Diagnosis not present

## 2020-02-04 DIAGNOSIS — N179 Acute kidney failure, unspecified: Secondary | ICD-10-CM | POA: Diagnosis not present

## 2020-02-04 DIAGNOSIS — Z79899 Other long term (current) drug therapy: Secondary | ICD-10-CM | POA: Diagnosis not present

## 2020-02-04 DIAGNOSIS — C310 Malignant neoplasm of maxillary sinus: Secondary | ICD-10-CM | POA: Diagnosis not present

## 2020-02-06 DIAGNOSIS — C312 Malignant neoplasm of frontal sinus: Secondary | ICD-10-CM | POA: Diagnosis not present

## 2020-02-06 DIAGNOSIS — C310 Malignant neoplasm of maxillary sinus: Secondary | ICD-10-CM | POA: Diagnosis not present

## 2020-02-06 DIAGNOSIS — C311 Malignant neoplasm of ethmoidal sinus: Secondary | ICD-10-CM | POA: Diagnosis not present

## 2020-02-11 DIAGNOSIS — N179 Acute kidney failure, unspecified: Secondary | ICD-10-CM | POA: Diagnosis not present

## 2020-02-11 DIAGNOSIS — Z79899 Other long term (current) drug therapy: Secondary | ICD-10-CM | POA: Diagnosis not present

## 2020-02-11 DIAGNOSIS — C310 Malignant neoplasm of maxillary sinus: Secondary | ICD-10-CM | POA: Diagnosis not present

## 2020-02-16 DIAGNOSIS — Z87448 Personal history of other diseases of urinary system: Secondary | ICD-10-CM | POA: Diagnosis not present

## 2020-02-16 DIAGNOSIS — C310 Malignant neoplasm of maxillary sinus: Secondary | ICD-10-CM | POA: Diagnosis not present

## 2020-02-16 DIAGNOSIS — I10 Essential (primary) hypertension: Secondary | ICD-10-CM | POA: Diagnosis not present

## 2020-02-16 DIAGNOSIS — F1010 Alcohol abuse, uncomplicated: Secondary | ICD-10-CM | POA: Diagnosis not present

## 2020-02-16 DIAGNOSIS — Z0000 Encounter for general adult medical examination without abnormal findings: Secondary | ICD-10-CM | POA: Diagnosis not present

## 2020-02-18 DIAGNOSIS — R5383 Other fatigue: Secondary | ICD-10-CM | POA: Diagnosis not present

## 2020-02-18 DIAGNOSIS — Z79899 Other long term (current) drug therapy: Secondary | ICD-10-CM | POA: Diagnosis not present

## 2020-02-18 DIAGNOSIS — R0609 Other forms of dyspnea: Secondary | ICD-10-CM | POA: Diagnosis not present

## 2020-02-18 DIAGNOSIS — C310 Malignant neoplasm of maxillary sinus: Secondary | ICD-10-CM | POA: Diagnosis not present

## 2020-02-19 DIAGNOSIS — C310 Malignant neoplasm of maxillary sinus: Secondary | ICD-10-CM | POA: Diagnosis not present

## 2020-02-23 DIAGNOSIS — C310 Malignant neoplasm of maxillary sinus: Secondary | ICD-10-CM | POA: Diagnosis not present

## 2020-02-23 DIAGNOSIS — Z510 Encounter for antineoplastic radiation therapy: Secondary | ICD-10-CM | POA: Diagnosis not present

## 2020-02-24 DIAGNOSIS — Z510 Encounter for antineoplastic radiation therapy: Secondary | ICD-10-CM | POA: Diagnosis not present

## 2020-02-24 DIAGNOSIS — C310 Malignant neoplasm of maxillary sinus: Secondary | ICD-10-CM | POA: Diagnosis not present

## 2020-02-25 DIAGNOSIS — D631 Anemia in chronic kidney disease: Secondary | ICD-10-CM | POA: Diagnosis not present

## 2020-02-25 DIAGNOSIS — C310 Malignant neoplasm of maxillary sinus: Secondary | ICD-10-CM | POA: Diagnosis not present

## 2020-02-25 DIAGNOSIS — N189 Chronic kidney disease, unspecified: Secondary | ICD-10-CM | POA: Diagnosis not present

## 2020-02-25 DIAGNOSIS — Z510 Encounter for antineoplastic radiation therapy: Secondary | ICD-10-CM | POA: Diagnosis not present

## 2020-02-25 DIAGNOSIS — T451X5D Adverse effect of antineoplastic and immunosuppressive drugs, subsequent encounter: Secondary | ICD-10-CM | POA: Diagnosis not present

## 2020-02-25 DIAGNOSIS — T451X5A Adverse effect of antineoplastic and immunosuppressive drugs, initial encounter: Secondary | ICD-10-CM | POA: Diagnosis not present

## 2020-02-25 DIAGNOSIS — Z79899 Other long term (current) drug therapy: Secondary | ICD-10-CM | POA: Diagnosis not present

## 2020-02-25 DIAGNOSIS — R0609 Other forms of dyspnea: Secondary | ICD-10-CM | POA: Diagnosis not present

## 2020-02-25 DIAGNOSIS — D6481 Anemia due to antineoplastic chemotherapy: Secondary | ICD-10-CM | POA: Diagnosis not present

## 2020-02-26 DIAGNOSIS — Z510 Encounter for antineoplastic radiation therapy: Secondary | ICD-10-CM | POA: Diagnosis not present

## 2020-02-26 DIAGNOSIS — C310 Malignant neoplasm of maxillary sinus: Secondary | ICD-10-CM | POA: Diagnosis not present

## 2020-02-27 DIAGNOSIS — C310 Malignant neoplasm of maxillary sinus: Secondary | ICD-10-CM | POA: Diagnosis not present

## 2020-03-01 DIAGNOSIS — C310 Malignant neoplasm of maxillary sinus: Secondary | ICD-10-CM | POA: Diagnosis not present

## 2020-03-01 DIAGNOSIS — Z510 Encounter for antineoplastic radiation therapy: Secondary | ICD-10-CM | POA: Diagnosis not present

## 2020-03-02 DIAGNOSIS — C310 Malignant neoplasm of maxillary sinus: Secondary | ICD-10-CM | POA: Diagnosis not present

## 2020-03-02 DIAGNOSIS — Z510 Encounter for antineoplastic radiation therapy: Secondary | ICD-10-CM | POA: Diagnosis not present

## 2020-03-03 DIAGNOSIS — Z79899 Other long term (current) drug therapy: Secondary | ICD-10-CM | POA: Diagnosis not present

## 2020-03-03 DIAGNOSIS — Z510 Encounter for antineoplastic radiation therapy: Secondary | ICD-10-CM | POA: Diagnosis not present

## 2020-03-03 DIAGNOSIS — C310 Malignant neoplasm of maxillary sinus: Secondary | ICD-10-CM | POA: Diagnosis not present

## 2020-03-04 DIAGNOSIS — C310 Malignant neoplasm of maxillary sinus: Secondary | ICD-10-CM | POA: Diagnosis not present

## 2020-03-04 DIAGNOSIS — Z510 Encounter for antineoplastic radiation therapy: Secondary | ICD-10-CM | POA: Diagnosis not present

## 2020-03-05 DIAGNOSIS — C310 Malignant neoplasm of maxillary sinus: Secondary | ICD-10-CM | POA: Diagnosis not present

## 2020-03-05 DIAGNOSIS — Z510 Encounter for antineoplastic radiation therapy: Secondary | ICD-10-CM | POA: Diagnosis not present

## 2020-03-08 DIAGNOSIS — Z510 Encounter for antineoplastic radiation therapy: Secondary | ICD-10-CM | POA: Diagnosis not present

## 2020-03-08 DIAGNOSIS — C310 Malignant neoplasm of maxillary sinus: Secondary | ICD-10-CM | POA: Diagnosis not present

## 2020-03-09 DIAGNOSIS — C310 Malignant neoplasm of maxillary sinus: Secondary | ICD-10-CM | POA: Diagnosis not present

## 2020-03-09 DIAGNOSIS — Z510 Encounter for antineoplastic radiation therapy: Secondary | ICD-10-CM | POA: Diagnosis not present

## 2020-03-10 DIAGNOSIS — Z79899 Other long term (current) drug therapy: Secondary | ICD-10-CM | POA: Diagnosis not present

## 2020-03-10 DIAGNOSIS — Z510 Encounter for antineoplastic radiation therapy: Secondary | ICD-10-CM | POA: Diagnosis not present

## 2020-03-10 DIAGNOSIS — R682 Dry mouth, unspecified: Secondary | ICD-10-CM | POA: Diagnosis not present

## 2020-03-10 DIAGNOSIS — R0602 Shortness of breath: Secondary | ICD-10-CM | POA: Diagnosis not present

## 2020-03-10 DIAGNOSIS — R439 Unspecified disturbances of smell and taste: Secondary | ICD-10-CM | POA: Diagnosis not present

## 2020-03-10 DIAGNOSIS — R0609 Other forms of dyspnea: Secondary | ICD-10-CM | POA: Diagnosis not present

## 2020-03-10 DIAGNOSIS — Z7952 Long term (current) use of systemic steroids: Secondary | ICD-10-CM | POA: Diagnosis not present

## 2020-03-10 DIAGNOSIS — Z5111 Encounter for antineoplastic chemotherapy: Secondary | ICD-10-CM | POA: Diagnosis not present

## 2020-03-10 DIAGNOSIS — C310 Malignant neoplasm of maxillary sinus: Secondary | ICD-10-CM | POA: Diagnosis not present

## 2020-03-10 DIAGNOSIS — R5383 Other fatigue: Secondary | ICD-10-CM | POA: Diagnosis not present

## 2020-03-10 DIAGNOSIS — I10 Essential (primary) hypertension: Secondary | ICD-10-CM | POA: Diagnosis not present

## 2020-03-10 DIAGNOSIS — E7800 Pure hypercholesterolemia, unspecified: Secondary | ICD-10-CM | POA: Diagnosis not present

## 2020-03-11 DIAGNOSIS — Z510 Encounter for antineoplastic radiation therapy: Secondary | ICD-10-CM | POA: Diagnosis not present

## 2020-03-11 DIAGNOSIS — C310 Malignant neoplasm of maxillary sinus: Secondary | ICD-10-CM | POA: Diagnosis not present

## 2020-03-12 DIAGNOSIS — C310 Malignant neoplasm of maxillary sinus: Secondary | ICD-10-CM | POA: Diagnosis not present

## 2020-03-12 DIAGNOSIS — Z510 Encounter for antineoplastic radiation therapy: Secondary | ICD-10-CM | POA: Diagnosis not present

## 2020-03-15 DIAGNOSIS — C310 Malignant neoplasm of maxillary sinus: Secondary | ICD-10-CM | POA: Diagnosis not present

## 2020-03-15 DIAGNOSIS — R1312 Dysphagia, oropharyngeal phase: Secondary | ICD-10-CM | POA: Diagnosis not present

## 2020-03-15 DIAGNOSIS — Z510 Encounter for antineoplastic radiation therapy: Secondary | ICD-10-CM | POA: Diagnosis not present

## 2020-03-16 DIAGNOSIS — K429 Umbilical hernia without obstruction or gangrene: Secondary | ICD-10-CM | POA: Diagnosis not present

## 2020-03-16 DIAGNOSIS — C310 Malignant neoplasm of maxillary sinus: Secondary | ICD-10-CM | POA: Diagnosis not present

## 2020-03-16 DIAGNOSIS — Z510 Encounter for antineoplastic radiation therapy: Secondary | ICD-10-CM | POA: Diagnosis not present

## 2020-03-17 DIAGNOSIS — N289 Disorder of kidney and ureter, unspecified: Secondary | ICD-10-CM | POA: Diagnosis not present

## 2020-03-17 DIAGNOSIS — Z510 Encounter for antineoplastic radiation therapy: Secondary | ICD-10-CM | POA: Diagnosis not present

## 2020-03-17 DIAGNOSIS — C310 Malignant neoplasm of maxillary sinus: Secondary | ICD-10-CM | POA: Diagnosis not present

## 2020-03-17 DIAGNOSIS — D649 Anemia, unspecified: Secondary | ICD-10-CM | POA: Diagnosis not present

## 2020-03-17 DIAGNOSIS — Z79899 Other long term (current) drug therapy: Secondary | ICD-10-CM | POA: Diagnosis not present

## 2020-03-17 DIAGNOSIS — Z7952 Long term (current) use of systemic steroids: Secondary | ICD-10-CM | POA: Diagnosis not present

## 2020-03-18 DIAGNOSIS — C310 Malignant neoplasm of maxillary sinus: Secondary | ICD-10-CM | POA: Diagnosis not present

## 2020-03-19 DIAGNOSIS — C310 Malignant neoplasm of maxillary sinus: Secondary | ICD-10-CM | POA: Diagnosis not present

## 2020-03-22 DIAGNOSIS — Z510 Encounter for antineoplastic radiation therapy: Secondary | ICD-10-CM | POA: Diagnosis not present

## 2020-03-22 DIAGNOSIS — C310 Malignant neoplasm of maxillary sinus: Secondary | ICD-10-CM | POA: Diagnosis not present

## 2020-03-23 DIAGNOSIS — C310 Malignant neoplasm of maxillary sinus: Secondary | ICD-10-CM | POA: Diagnosis not present

## 2020-03-23 DIAGNOSIS — Z510 Encounter for antineoplastic radiation therapy: Secondary | ICD-10-CM | POA: Diagnosis not present

## 2020-03-24 DIAGNOSIS — D649 Anemia, unspecified: Secondary | ICD-10-CM | POA: Diagnosis not present

## 2020-03-24 DIAGNOSIS — K1230 Oral mucositis (ulcerative), unspecified: Secondary | ICD-10-CM | POA: Diagnosis not present

## 2020-03-24 DIAGNOSIS — N289 Disorder of kidney and ureter, unspecified: Secondary | ICD-10-CM | POA: Diagnosis not present

## 2020-03-24 DIAGNOSIS — C310 Malignant neoplasm of maxillary sinus: Secondary | ICD-10-CM | POA: Diagnosis not present

## 2020-03-24 DIAGNOSIS — Z510 Encounter for antineoplastic radiation therapy: Secondary | ICD-10-CM | POA: Diagnosis not present

## 2020-03-24 DIAGNOSIS — R001 Bradycardia, unspecified: Secondary | ICD-10-CM | POA: Diagnosis not present

## 2020-03-24 DIAGNOSIS — Z5111 Encounter for antineoplastic chemotherapy: Secondary | ICD-10-CM | POA: Diagnosis not present

## 2020-03-24 DIAGNOSIS — R1312 Dysphagia, oropharyngeal phase: Secondary | ICD-10-CM | POA: Diagnosis not present

## 2020-03-24 DIAGNOSIS — R5383 Other fatigue: Secondary | ICD-10-CM | POA: Diagnosis not present

## 2020-03-24 DIAGNOSIS — R0609 Other forms of dyspnea: Secondary | ICD-10-CM | POA: Diagnosis not present

## 2020-03-25 DIAGNOSIS — Z510 Encounter for antineoplastic radiation therapy: Secondary | ICD-10-CM | POA: Diagnosis not present

## 2020-03-25 DIAGNOSIS — C310 Malignant neoplasm of maxillary sinus: Secondary | ICD-10-CM | POA: Diagnosis not present

## 2020-03-26 DIAGNOSIS — Z510 Encounter for antineoplastic radiation therapy: Secondary | ICD-10-CM | POA: Diagnosis not present

## 2020-03-26 DIAGNOSIS — C310 Malignant neoplasm of maxillary sinus: Secondary | ICD-10-CM | POA: Diagnosis not present

## 2020-03-29 DIAGNOSIS — R1313 Dysphagia, pharyngeal phase: Secondary | ICD-10-CM | POA: Diagnosis not present

## 2020-03-29 DIAGNOSIS — Z510 Encounter for antineoplastic radiation therapy: Secondary | ICD-10-CM | POA: Diagnosis not present

## 2020-03-29 DIAGNOSIS — C310 Malignant neoplasm of maxillary sinus: Secondary | ICD-10-CM | POA: Diagnosis not present

## 2020-03-29 DIAGNOSIS — C760 Malignant neoplasm of head, face and neck: Secondary | ICD-10-CM | POA: Diagnosis not present

## 2020-03-30 DIAGNOSIS — Z510 Encounter for antineoplastic radiation therapy: Secondary | ICD-10-CM | POA: Diagnosis not present

## 2020-03-30 DIAGNOSIS — C310 Malignant neoplasm of maxillary sinus: Secondary | ICD-10-CM | POA: Diagnosis not present

## 2020-03-31 DIAGNOSIS — C310 Malignant neoplasm of maxillary sinus: Secondary | ICD-10-CM | POA: Diagnosis not present

## 2020-03-31 DIAGNOSIS — Z510 Encounter for antineoplastic radiation therapy: Secondary | ICD-10-CM | POA: Diagnosis not present

## 2020-03-31 DIAGNOSIS — D649 Anemia, unspecified: Secondary | ICD-10-CM | POA: Diagnosis not present

## 2020-03-31 DIAGNOSIS — N289 Disorder of kidney and ureter, unspecified: Secondary | ICD-10-CM | POA: Diagnosis not present

## 2020-03-31 DIAGNOSIS — R5383 Other fatigue: Secondary | ICD-10-CM | POA: Diagnosis not present

## 2020-03-31 DIAGNOSIS — Z79899 Other long term (current) drug therapy: Secondary | ICD-10-CM | POA: Diagnosis not present

## 2020-03-31 DIAGNOSIS — L598 Other specified disorders of the skin and subcutaneous tissue related to radiation: Secondary | ICD-10-CM | POA: Diagnosis not present

## 2020-03-31 DIAGNOSIS — Z5111 Encounter for antineoplastic chemotherapy: Secondary | ICD-10-CM | POA: Diagnosis not present

## 2020-03-31 DIAGNOSIS — T451X5A Adverse effect of antineoplastic and immunosuppressive drugs, initial encounter: Secondary | ICD-10-CM | POA: Diagnosis not present

## 2020-03-31 DIAGNOSIS — Z7952 Long term (current) use of systemic steroids: Secondary | ICD-10-CM | POA: Diagnosis not present

## 2020-03-31 DIAGNOSIS — K1230 Oral mucositis (ulcerative), unspecified: Secondary | ICD-10-CM | POA: Diagnosis not present

## 2020-04-01 DIAGNOSIS — C310 Malignant neoplasm of maxillary sinus: Secondary | ICD-10-CM | POA: Diagnosis not present

## 2020-04-01 DIAGNOSIS — Z510 Encounter for antineoplastic radiation therapy: Secondary | ICD-10-CM | POA: Diagnosis not present

## 2020-04-02 DIAGNOSIS — Z510 Encounter for antineoplastic radiation therapy: Secondary | ICD-10-CM | POA: Diagnosis not present

## 2020-04-02 DIAGNOSIS — C310 Malignant neoplasm of maxillary sinus: Secondary | ICD-10-CM | POA: Diagnosis not present

## 2020-04-05 DIAGNOSIS — C310 Malignant neoplasm of maxillary sinus: Secondary | ICD-10-CM | POA: Diagnosis not present

## 2020-04-05 DIAGNOSIS — C311 Malignant neoplasm of ethmoidal sinus: Secondary | ICD-10-CM | POA: Diagnosis not present

## 2020-04-05 DIAGNOSIS — Z510 Encounter for antineoplastic radiation therapy: Secondary | ICD-10-CM | POA: Diagnosis not present

## 2020-04-05 DIAGNOSIS — C410 Malignant neoplasm of bones of skull and face: Secondary | ICD-10-CM | POA: Diagnosis not present

## 2020-04-05 DIAGNOSIS — Z483 Aftercare following surgery for neoplasm: Secondary | ICD-10-CM | POA: Diagnosis not present

## 2020-04-05 DIAGNOSIS — Z9889 Other specified postprocedural states: Secondary | ICD-10-CM | POA: Diagnosis not present

## 2020-04-05 DIAGNOSIS — R1313 Dysphagia, pharyngeal phase: Secondary | ICD-10-CM | POA: Diagnosis not present

## 2020-04-05 DIAGNOSIS — C312 Malignant neoplasm of frontal sinus: Secondary | ICD-10-CM | POA: Diagnosis not present

## 2020-04-06 DIAGNOSIS — Z510 Encounter for antineoplastic radiation therapy: Secondary | ICD-10-CM | POA: Diagnosis not present

## 2020-04-06 DIAGNOSIS — C310 Malignant neoplasm of maxillary sinus: Secondary | ICD-10-CM | POA: Diagnosis not present

## 2020-04-07 DIAGNOSIS — C310 Malignant neoplasm of maxillary sinus: Secondary | ICD-10-CM | POA: Diagnosis not present

## 2020-04-07 DIAGNOSIS — R432 Parageusia: Secondary | ICD-10-CM | POA: Diagnosis not present

## 2020-04-07 DIAGNOSIS — K1230 Oral mucositis (ulcerative), unspecified: Secondary | ICD-10-CM | POA: Diagnosis not present

## 2020-04-07 DIAGNOSIS — L598 Other specified disorders of the skin and subcutaneous tissue related to radiation: Secondary | ICD-10-CM | POA: Diagnosis not present

## 2020-04-07 DIAGNOSIS — N179 Acute kidney failure, unspecified: Secondary | ICD-10-CM | POA: Diagnosis not present

## 2020-04-07 DIAGNOSIS — Z510 Encounter for antineoplastic radiation therapy: Secondary | ICD-10-CM | POA: Diagnosis not present

## 2020-04-07 DIAGNOSIS — R001 Bradycardia, unspecified: Secondary | ICD-10-CM | POA: Diagnosis not present

## 2020-04-07 DIAGNOSIS — Z79899 Other long term (current) drug therapy: Secondary | ICD-10-CM | POA: Diagnosis not present

## 2020-04-07 DIAGNOSIS — D649 Anemia, unspecified: Secondary | ICD-10-CM | POA: Diagnosis not present

## 2020-04-08 DIAGNOSIS — C310 Malignant neoplasm of maxillary sinus: Secondary | ICD-10-CM | POA: Diagnosis not present

## 2020-04-08 DIAGNOSIS — Z510 Encounter for antineoplastic radiation therapy: Secondary | ICD-10-CM | POA: Diagnosis not present

## 2020-04-09 DIAGNOSIS — C310 Malignant neoplasm of maxillary sinus: Secondary | ICD-10-CM | POA: Diagnosis not present

## 2020-04-15 DIAGNOSIS — I10 Essential (primary) hypertension: Secondary | ICD-10-CM | POA: Diagnosis not present

## 2020-04-15 DIAGNOSIS — E7800 Pure hypercholesterolemia, unspecified: Secondary | ICD-10-CM | POA: Diagnosis not present

## 2020-04-21 DIAGNOSIS — K1230 Oral mucositis (ulcerative), unspecified: Secondary | ICD-10-CM | POA: Diagnosis not present

## 2020-04-21 DIAGNOSIS — R432 Parageusia: Secondary | ICD-10-CM | POA: Diagnosis not present

## 2020-04-21 DIAGNOSIS — D649 Anemia, unspecified: Secondary | ICD-10-CM | POA: Diagnosis not present

## 2020-04-21 DIAGNOSIS — C310 Malignant neoplasm of maxillary sinus: Secondary | ICD-10-CM | POA: Diagnosis not present

## 2020-04-21 DIAGNOSIS — N179 Acute kidney failure, unspecified: Secondary | ICD-10-CM | POA: Diagnosis not present

## 2020-04-21 DIAGNOSIS — R001 Bradycardia, unspecified: Secondary | ICD-10-CM | POA: Diagnosis not present

## 2020-04-21 DIAGNOSIS — L598 Other specified disorders of the skin and subcutaneous tissue related to radiation: Secondary | ICD-10-CM | POA: Diagnosis not present

## 2020-04-21 DIAGNOSIS — T451X5A Adverse effect of antineoplastic and immunosuppressive drugs, initial encounter: Secondary | ICD-10-CM | POA: Diagnosis not present

## 2020-04-26 DIAGNOSIS — N179 Acute kidney failure, unspecified: Secondary | ICD-10-CM | POA: Diagnosis not present

## 2020-04-26 DIAGNOSIS — E876 Hypokalemia: Secondary | ICD-10-CM | POA: Diagnosis not present

## 2020-04-26 DIAGNOSIS — N189 Chronic kidney disease, unspecified: Secondary | ICD-10-CM | POA: Diagnosis not present

## 2020-04-26 DIAGNOSIS — I129 Hypertensive chronic kidney disease with stage 1 through stage 4 chronic kidney disease, or unspecified chronic kidney disease: Secondary | ICD-10-CM | POA: Diagnosis not present

## 2020-05-13 DIAGNOSIS — Z9889 Other specified postprocedural states: Secondary | ICD-10-CM | POA: Diagnosis not present

## 2020-05-13 DIAGNOSIS — C312 Malignant neoplasm of frontal sinus: Secondary | ICD-10-CM | POA: Diagnosis not present

## 2020-05-13 DIAGNOSIS — C311 Malignant neoplasm of ethmoidal sinus: Secondary | ICD-10-CM | POA: Diagnosis not present

## 2020-05-13 DIAGNOSIS — R1312 Dysphagia, oropharyngeal phase: Secondary | ICD-10-CM | POA: Diagnosis not present

## 2020-05-13 DIAGNOSIS — Z923 Personal history of irradiation: Secondary | ICD-10-CM | POA: Diagnosis not present

## 2020-05-13 DIAGNOSIS — C310 Malignant neoplasm of maxillary sinus: Secondary | ICD-10-CM | POA: Diagnosis not present

## 2020-05-13 DIAGNOSIS — Z9221 Personal history of antineoplastic chemotherapy: Secondary | ICD-10-CM | POA: Diagnosis not present

## 2020-06-09 DIAGNOSIS — I10 Essential (primary) hypertension: Secondary | ICD-10-CM | POA: Diagnosis not present

## 2020-06-09 DIAGNOSIS — E7800 Pure hypercholesterolemia, unspecified: Secondary | ICD-10-CM | POA: Diagnosis not present

## 2020-07-07 DIAGNOSIS — C310 Malignant neoplasm of maxillary sinus: Secondary | ICD-10-CM | POA: Diagnosis not present

## 2020-07-09 DIAGNOSIS — N179 Acute kidney failure, unspecified: Secondary | ICD-10-CM | POA: Diagnosis not present

## 2020-07-09 DIAGNOSIS — I159 Secondary hypertension, unspecified: Secondary | ICD-10-CM | POA: Diagnosis not present

## 2020-07-09 DIAGNOSIS — Z08 Encounter for follow-up examination after completed treatment for malignant neoplasm: Secondary | ICD-10-CM | POA: Diagnosis not present

## 2020-07-09 DIAGNOSIS — C310 Malignant neoplasm of maxillary sinus: Secondary | ICD-10-CM | POA: Diagnosis not present

## 2020-07-09 DIAGNOSIS — Z79899 Other long term (current) drug therapy: Secondary | ICD-10-CM | POA: Diagnosis not present

## 2020-07-09 DIAGNOSIS — N281 Cyst of kidney, acquired: Secondary | ICD-10-CM | POA: Diagnosis not present

## 2020-07-09 DIAGNOSIS — I10 Essential (primary) hypertension: Secondary | ICD-10-CM | POA: Diagnosis not present

## 2020-07-09 DIAGNOSIS — Z8589 Personal history of malignant neoplasm of other organs and systems: Secondary | ICD-10-CM | POA: Diagnosis not present

## 2020-08-08 DIAGNOSIS — E7800 Pure hypercholesterolemia, unspecified: Secondary | ICD-10-CM | POA: Diagnosis not present

## 2020-08-08 DIAGNOSIS — I10 Essential (primary) hypertension: Secondary | ICD-10-CM | POA: Diagnosis not present

## 2020-08-18 DIAGNOSIS — F1010 Alcohol abuse, uncomplicated: Secondary | ICD-10-CM | POA: Diagnosis not present

## 2020-08-18 DIAGNOSIS — I10 Essential (primary) hypertension: Secondary | ICD-10-CM | POA: Diagnosis not present

## 2020-08-18 DIAGNOSIS — E7800 Pure hypercholesterolemia, unspecified: Secondary | ICD-10-CM | POA: Diagnosis not present

## 2020-08-18 DIAGNOSIS — N529 Male erectile dysfunction, unspecified: Secondary | ICD-10-CM | POA: Diagnosis not present

## 2020-08-18 DIAGNOSIS — C310 Malignant neoplasm of maxillary sinus: Secondary | ICD-10-CM | POA: Diagnosis not present

## 2020-08-18 DIAGNOSIS — I441 Atrioventricular block, second degree: Secondary | ICD-10-CM | POA: Diagnosis not present

## 2020-08-18 DIAGNOSIS — M109 Gout, unspecified: Secondary | ICD-10-CM | POA: Diagnosis not present

## 2020-09-08 DIAGNOSIS — H52223 Regular astigmatism, bilateral: Secondary | ICD-10-CM | POA: Diagnosis not present

## 2020-09-08 DIAGNOSIS — Z135 Encounter for screening for eye and ear disorders: Secondary | ICD-10-CM | POA: Diagnosis not present

## 2020-09-08 DIAGNOSIS — H2513 Age-related nuclear cataract, bilateral: Secondary | ICD-10-CM | POA: Diagnosis not present

## 2020-09-08 DIAGNOSIS — H5213 Myopia, bilateral: Secondary | ICD-10-CM | POA: Diagnosis not present

## 2020-09-08 DIAGNOSIS — H524 Presbyopia: Secondary | ICD-10-CM | POA: Diagnosis not present

## 2020-10-08 DIAGNOSIS — C310 Malignant neoplasm of maxillary sinus: Secondary | ICD-10-CM | POA: Diagnosis not present

## 2020-10-08 DIAGNOSIS — N281 Cyst of kidney, acquired: Secondary | ICD-10-CM | POA: Diagnosis not present

## 2020-10-08 DIAGNOSIS — Z923 Personal history of irradiation: Secondary | ICD-10-CM | POA: Diagnosis not present

## 2020-10-08 DIAGNOSIS — Z9221 Personal history of antineoplastic chemotherapy: Secondary | ICD-10-CM | POA: Diagnosis not present

## 2020-10-08 DIAGNOSIS — E349 Endocrine disorder, unspecified: Secondary | ICD-10-CM | POA: Diagnosis not present

## 2020-10-08 DIAGNOSIS — L814 Other melanin hyperpigmentation: Secondary | ICD-10-CM | POA: Diagnosis not present

## 2020-10-08 DIAGNOSIS — M799 Soft tissue disorder, unspecified: Secondary | ICD-10-CM | POA: Diagnosis not present

## 2020-10-12 DIAGNOSIS — R946 Abnormal results of thyroid function studies: Secondary | ICD-10-CM | POA: Diagnosis not present

## 2020-10-12 DIAGNOSIS — C310 Malignant neoplasm of maxillary sinus: Secondary | ICD-10-CM | POA: Diagnosis not present

## 2020-10-18 DIAGNOSIS — I10 Essential (primary) hypertension: Secondary | ICD-10-CM | POA: Diagnosis not present

## 2020-10-18 DIAGNOSIS — E7800 Pure hypercholesterolemia, unspecified: Secondary | ICD-10-CM | POA: Diagnosis not present

## 2020-10-18 DIAGNOSIS — E785 Hyperlipidemia, unspecified: Secondary | ICD-10-CM | POA: Diagnosis not present

## 2020-10-28 DIAGNOSIS — I10 Essential (primary) hypertension: Secondary | ICD-10-CM | POA: Diagnosis not present

## 2020-10-28 DIAGNOSIS — N179 Acute kidney failure, unspecified: Secondary | ICD-10-CM | POA: Diagnosis not present

## 2020-10-28 DIAGNOSIS — N189 Chronic kidney disease, unspecified: Secondary | ICD-10-CM | POA: Diagnosis not present

## 2020-10-28 DIAGNOSIS — I129 Hypertensive chronic kidney disease with stage 1 through stage 4 chronic kidney disease, or unspecified chronic kidney disease: Secondary | ICD-10-CM | POA: Diagnosis not present

## 2020-11-21 DIAGNOSIS — I10 Essential (primary) hypertension: Secondary | ICD-10-CM | POA: Diagnosis not present

## 2020-11-21 DIAGNOSIS — E7800 Pure hypercholesterolemia, unspecified: Secondary | ICD-10-CM | POA: Diagnosis not present

## 2020-11-21 DIAGNOSIS — E785 Hyperlipidemia, unspecified: Secondary | ICD-10-CM | POA: Diagnosis not present

## 2021-01-05 DIAGNOSIS — C310 Malignant neoplasm of maxillary sinus: Secondary | ICD-10-CM | POA: Diagnosis not present

## 2021-01-05 DIAGNOSIS — Z79899 Other long term (current) drug therapy: Secondary | ICD-10-CM | POA: Diagnosis not present

## 2021-01-05 DIAGNOSIS — I10 Essential (primary) hypertension: Secondary | ICD-10-CM | POA: Diagnosis not present

## 2021-01-05 DIAGNOSIS — N2889 Other specified disorders of kidney and ureter: Secondary | ICD-10-CM | POA: Diagnosis not present

## 2021-01-05 DIAGNOSIS — N289 Disorder of kidney and ureter, unspecified: Secondary | ICD-10-CM | POA: Diagnosis not present

## 2021-02-28 DIAGNOSIS — C310 Malignant neoplasm of maxillary sinus: Secondary | ICD-10-CM | POA: Diagnosis not present

## 2021-02-28 DIAGNOSIS — M797 Fibromyalgia: Secondary | ICD-10-CM | POA: Diagnosis not present

## 2021-02-28 DIAGNOSIS — H9190 Unspecified hearing loss, unspecified ear: Secondary | ICD-10-CM | POA: Diagnosis not present

## 2021-02-28 DIAGNOSIS — I10 Essential (primary) hypertension: Secondary | ICD-10-CM | POA: Diagnosis not present

## 2021-02-28 DIAGNOSIS — E7800 Pure hypercholesterolemia, unspecified: Secondary | ICD-10-CM | POA: Diagnosis not present

## 2021-02-28 DIAGNOSIS — Z0000 Encounter for general adult medical examination without abnormal findings: Secondary | ICD-10-CM | POA: Diagnosis not present

## 2021-02-28 DIAGNOSIS — F1020 Alcohol dependence, uncomplicated: Secondary | ICD-10-CM | POA: Diagnosis not present

## 2021-02-28 DIAGNOSIS — Z125 Encounter for screening for malignant neoplasm of prostate: Secondary | ICD-10-CM | POA: Diagnosis not present

## 2021-02-28 DIAGNOSIS — M109 Gout, unspecified: Secondary | ICD-10-CM | POA: Diagnosis not present

## 2021-04-20 DIAGNOSIS — C310 Malignant neoplasm of maxillary sinus: Secondary | ICD-10-CM | POA: Diagnosis not present

## 2021-04-20 DIAGNOSIS — N2889 Other specified disorders of kidney and ureter: Secondary | ICD-10-CM | POA: Diagnosis not present

## 2021-04-20 DIAGNOSIS — N289 Disorder of kidney and ureter, unspecified: Secondary | ICD-10-CM | POA: Diagnosis not present

## 2021-04-22 DIAGNOSIS — Z08 Encounter for follow-up examination after completed treatment for malignant neoplasm: Secondary | ICD-10-CM | POA: Diagnosis not present

## 2021-04-22 DIAGNOSIS — Z8522 Personal history of malignant neoplasm of nasal cavities, middle ear, and accessory sinuses: Secondary | ICD-10-CM | POA: Diagnosis not present

## 2021-04-22 DIAGNOSIS — C310 Malignant neoplasm of maxillary sinus: Secondary | ICD-10-CM | POA: Diagnosis not present

## 2021-05-06 DIAGNOSIS — N1832 Chronic kidney disease, stage 3b: Secondary | ICD-10-CM | POA: Diagnosis not present

## 2021-05-06 DIAGNOSIS — N183 Chronic kidney disease, stage 3 unspecified: Secondary | ICD-10-CM | POA: Diagnosis not present

## 2021-05-06 DIAGNOSIS — Z79899 Other long term (current) drug therapy: Secondary | ICD-10-CM | POA: Diagnosis not present

## 2021-05-06 DIAGNOSIS — E876 Hypokalemia: Secondary | ICD-10-CM | POA: Diagnosis not present

## 2021-05-06 DIAGNOSIS — N179 Acute kidney failure, unspecified: Secondary | ICD-10-CM | POA: Diagnosis not present

## 2021-05-06 DIAGNOSIS — I129 Hypertensive chronic kidney disease with stage 1 through stage 4 chronic kidney disease, or unspecified chronic kidney disease: Secondary | ICD-10-CM | POA: Diagnosis not present

## 2021-05-06 DIAGNOSIS — D759 Disease of blood and blood-forming organs, unspecified: Secondary | ICD-10-CM | POA: Diagnosis not present

## 2021-07-27 DIAGNOSIS — E785 Hyperlipidemia, unspecified: Secondary | ICD-10-CM | POA: Diagnosis not present

## 2021-07-27 DIAGNOSIS — I10 Essential (primary) hypertension: Secondary | ICD-10-CM | POA: Diagnosis not present

## 2021-07-27 DIAGNOSIS — E7800 Pure hypercholesterolemia, unspecified: Secondary | ICD-10-CM | POA: Diagnosis not present

## 2021-08-30 ENCOUNTER — Telehealth: Payer: Self-pay | Admitting: Cardiology

## 2021-08-30 NOTE — Telephone Encounter (Signed)
   Pre-operative Risk Assessment    Patient Name: KIPLING GRASER  DOB: 12/16/1945 MRN: [id number]      Request for Surgical Clearance    Procedure:   COLONOSCOPY  Date of Surgery:  Clearance 09/07/21                               Surgeon:  DR Wilford Corner Surgeon's Group or Practice Name:  [facility] Phone number:  [phone] Fax number:  [phone]  Type of Clearance Requested:   - Medical    Type of Anesthesia:  Not Indicated   Additional requests/questions:    Signed, Eli Phillips   08/30/2021, 11:08 AM

## 2021-08-30 NOTE — Telephone Encounter (Signed)
Primary Cardiologist:None  Chart reviewed as part of pre-operative protocol coverage. Because of Philip Anderson's past medical history and time since last visit, he/she will require a follow-up visit in order to better assess preoperative cardiovascular risk.  Pre-op covering staff: - Please schedule appointment and call patient to inform them. - Please contact requesting surgeon's office via preferred method (i.e, phone, fax) to inform them of need for appointment prior to surgery.  If applicable, this message will also be routed to pharmacy pool and/or primary cardiologist for input on holding anticoagulant/antiplatelet agent as requested below so that this information is available at time of patient's appointment.   Emmaline Life, NP-C    08/30/2021, 11:21 AM [facility] [address], [address] Office [phone] Fax [phone]

## 2021-08-31 NOTE — Progress Notes (Addendum)
Cardiology Office Note Date:  08/31/2021  Patient ID:  Philip Anderson 29-Jan-1945, MRN [id number] PCP:  Aretta Nip, MD  Cardiologist:  Dr. Virgina Jock Electrophysiologist: Dr. Quentin Ore n    Chief Complaint: pre-op  History of Present Illness: Philip Anderson is a 76 y.o. male with history of HTN, HLD  He was seen by Dr. Virgina Jock 09/18/19 for pre-op evaluation prior to nasal polyp surgery, and an abnormal EKG EKGs then with CHB and SB with junctional escape beat.o symptoms of brady, physically active. His doxazosin was stopped and referred to EP  He saw Dr. Quentin Ore who reviewed all the available EKGs felt to be Mobitz one with a competng junctional rhythm,  without evidence of CHB.  Planned to ETT to evaluate HR/and chronotropic competence.  At his follow up 10/10/19, noted echo which showed normal LV function and no significant valvular abnormalities. He also had an exercise ecg test during which he was able to achieve 85% (peak HR 125bpm) of his maximum predicted heart rate for 7 METS. Did not think he as at increased risk for advanced heart block with anesthesia, though did recommend that he be be on a heart monitor with any procedures with sedation, and suspected his resting/anesthesia rates likely would be in the 40's Recommended annual visit to monitor his conduction system with EKGs  This was his last EP visit  He comes in today to be seen for Dr. Quentin Ore now pending a colonoscopy requiring cardiology pre-op evaluation  RCRI score is zero, 0.4%  TODAY He is scheduled for a colonoscopy Wed next week for screening with hx of polyps removed in the past.  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 No SOB He has probably slowed some over the last year or so, but not dramatically since his last visit and has no difficulties with his ADLs Could jog up a flight of stairs without SOB, reports being very active No symptoms of bradycardia or angina   Past Medical History:  Diagnosis Date   High cholesterol    Hypertension    Second degree AV block, Mobitz type I    EP - Dr. Quentin Ore    Past Surgical History:  Procedure Laterality Date   COLONOSCOPY     MAXILLARY ANTROSTOMY Right 11/12/2019   Procedure: RIGHT ENDOSCOPIC MAXILLARY ANTROSTOMY WITH REMOVAL OF TISSUE;  Surgeon: [name], MD;  Location: [facility];  Service: ENT;  Laterality: Right;    Current Outpatient Medications  Medication Sig Dispense Refill   acetaminophen (TYLENOL) 500 MG tablet Take 250 mg by mouth daily as needed (nasal pain/sinus pain.).     allopurinol (ZYLOPRIM) 100 MG tablet Take 100 mg by mouth daily.     Alpha-Lipoic Acid 600 MG CAPS Take 600 mg by mouth 3 (three) times a week.     APPLE CIDER VINEGAR PO Take 1 capsule by mouth once a week.      aspirin EC 81 MG tablet Take 81 mg by mouth daily. Swallow whole.     atorvastatin (LIPITOR) 20 MG tablet Take 20 mg by mouth  daily.     cephALEXin (KEFLEX) 500 MG capsule Take 1 capsule (500 mg total) by mouth 3 (three) times daily. 15 capsule 0   Coenzyme Q10 (COQ10 PO) Take 15 mLs by mouth 3 (three) times a week.     doxazosin (CARDURA) 2 MG tablet Take 2 mg by mouth at bedtime.     HYDROcodone-acetaminophen (NORCO) 7.5-325 MG tablet Take 1 tablet by mouth every 6 (six) hours as needed for moderate pain. 20 tablet 0   lisinopril-hydrochlorothiazide (ZESTORETIC) 10-12.5 MG tablet Take 1 tablet by mouth every other day. In the morning     magnesium oxide (MAG-OX) 400 MG tablet Take 400 mg by mouth 2 (two) times a week.     Multiple  Vitamin (MULTIVITAMIN WITH MINERALS) TABS tablet Take 1 tablet by mouth daily.     ondansetron (ZOFRAN-ODT) 8 MG disintegrating tablet Take 1 tablet (8 mg total) by mouth every 8 (eight) hours as needed for nausea or vomiting. 20 tablet 1   oxymetazoline (AFRIN) 0.05 % nasal spray Place 1 spray into both nostrils daily at 8 pm.      TIADYLT ER 180 MG 24 hr capsule Take 180 mg by mouth daily.     No current facility-administered medications for this visit.    Allergies:   Patient has no known allergies.   Social History:  The patient  reports that he has never smoked. He has never used smokeless tobacco. He reports current alcohol use of about 14.0 Anderson drinks of alcohol per week. He reports that he does not currently use drugs.   Family History:  The patient's family history includes Diabetes in his brother; Heart disease in his father and mother; Hypertension in his brother.  ROS:  Please see the history of present illness.    All other systems are reviewed and otherwise negative.   PHYSICAL EXAM:  VS:  There were no vitals taken for this visit. BMI: There is no height or weight on file to calculate BMI. Well nourished, well developed, in no acute distress HEENT: normocephalic, atraumatic Neck: no JVD, carotid bruits or masses Cardiac:   RRR; no significant murmurs, no rubs, or gallops Lungs:   CTA b/l, no wheezing, rhonchi or rales Abd: soft, nontender MS: no deformity or atrophy Ext: no edema Skin: warm and dry, no rash Neuro:  No gross deficits appreciated Psych: euthymic mood, full affect   EKG:  Done today and reviewed by myself shows  Appears essentially unchanged from his last Dr. Quentin Ore called Second degree block with competing junctional rhythm. V rate 42 today  10/10/2019 HR was 46   10/07/2019: ETT Blood pressure demonstrated a normal response to exercise. There was no ST segment deviation noted during stress.   Exercise tolerance test with mildly impaired  exercise tolerance (4:15); no chest pain, normal blood pressure response, no ST changes, transient Mobitz 1 second-degree AV block in recovery; negative adequate exercise tolerance test; Duke treadmill score 4.  10/07/2019: TTE  1. Rhythm appears to be sinus rhythm with complete heart block and  junctional escape.   2. Left ventricular ejection fraction, by estimation, is 60 to 65%. Left  ventricular ejection fraction by 3D volume is 64 %. The left ventricle has  normal function. The left ventricle has no regional wall motion  abnormalities. Indeterminate diastolic  filling due to E-A fusion.   3. Right ventricular systolic function is normal. The right ventricular  size is normal. There is mildly elevated pulmonary artery systolic  pressure. The estimated right ventricular systolic pressure is 96.7 mmHg.   4. The mitral valve is grossly normal. Mild mitral valve regurgitation.  No evidence of mitral stenosis.   5. The aortic valve is tricuspid. There is mild calcification of the  aortic valve. Aortic valve regurgitation is not visualized. Mild aortic  valve sclerosis is present, with no evidence of aortic valve stenosis.   6. The inferior vena cava is normal in size with greater than 50%  respiratory variability, suggesting right atrial pressure of 3 mmHg.   Recent Labs: No results found for requested labs within last 365 days.  No results found for requested labs within last 365 days.   CrCl cannot be calculated (Patient's most recent lab result is older than the maximum 21 days allowed.).   Wt Readings from Last 3 Encounters:  11/12/19 181 lb (82.1 kg)  10/10/19 181 lb 3.2 oz (82.2 kg)  09/24/19 189 lb (85.7 kg)     Other studies reviewed: Additional studies/records reviewed today include: summarized above  ASSESSMENT AND PLAN:  Mobitz I, junctional rhythm, bradycardia known for him Today's EKG appears much the same/essentially unchanged from priors I recommend that we stop  his diltiazem and start amlodpine '5mg'$  daily forhis BP. He reports he has been on the dilt for years and prescribed for BP No prior cardiac/rhythm history until referred here ins 2021    Pre-procedure evaluation Low cardiac risk procedure Low cardiac risk score I think with his EKG essentially unchanged and no symptoms,  likely could proceed with his colonoscopy I think though that having him back Monday for EKG and BP check with med changes will help. Final pre-op thoughts pending his nurse visit Monday thogh I have discussed that I would anticipate proceeding as planned/prep with his diet   ADDEND: 09/06/21 Nurse visit yesterday 138/70 EKG read by DOD with Mobitz I with 2:1, PVC In my review of the EKG and priors, and last visit with Dr. Quentin Ore for the same. With no symptoms of bradycardia or clinical changes from 2021 Good BP with change in medication, again think he is a reasonable candidate for colonoscopy without further testing. Would again as previously recommended by Dr. Quentin Ore, not felt to be at a particularly increased risk of developing complete heart block during anaesthesia, though again recommend he be on a heart monitor during any procedure involving sedation.   Philip Standard, PA-C  Disposition: F/u with [place] in a month otherwise, sooner if needed  Current medicines are reviewed at length with the patient today.  The patient did not have any concerns regarding medicines.  Philip Night, PA-C 08/31/2021 7:34 PM     [address] [place] [address] [phone] (office)  [phone] (fax)

## 2021-09-01 ENCOUNTER — Ambulatory Visit: Payer: Medicare HMO | Admitting: Physician Assistant

## 2021-09-01 ENCOUNTER — Encounter: Payer: Self-pay | Admitting: Physician Assistant

## 2021-09-01 VITALS — BP 144/80 | HR 42 | Ht 69.5 in | Wt 185.0 lb

## 2021-09-01 DIAGNOSIS — I441 Atrioventricular block, second degree: Secondary | ICD-10-CM | POA: Diagnosis not present

## 2021-09-01 DIAGNOSIS — I498 Other specified cardiac arrhythmias: Secondary | ICD-10-CM

## 2021-09-01 DIAGNOSIS — R001 Bradycardia, unspecified: Secondary | ICD-10-CM | POA: Diagnosis not present

## 2021-09-01 DIAGNOSIS — Z01818 Encounter for other preprocedural examination: Secondary | ICD-10-CM

## 2021-09-01 MED ORDER — AMLODIPINE BESYLATE 5 MG PO TABS: 5.0000 mg | ORAL_TABLET | Freq: Every day | ORAL | 1 refills | Status: DC

## 2021-09-01 NOTE — Patient Instructions (Addendum)
  Medication Instructions:   START TAKING: NORVASC 5 MG ONCE A DAY   STOP TAKING AND REMOVE THIS MEDICATION FROM YOUR MEDICATION LIST: DILTIAZEM    *If you need a refill on your cardiac medications before your next appointment, please call your pharmacy*   Lab Work: NONE ORDERED  TODAY    If you have labs (blood work) drawn today and your tests are completely normal, you will receive your results only by: [place] (if you have MyChart) OR A paper copy in the mail If you have any lab test that is abnormal or we need to change your treatment, we will call you to review the results.    Testing/Procedures: NONE ORDERED  TODAY    Follow-Up: At [facility], you and your health needs are our priority.  As part of our continuing mission to provide you with exceptional heart care, we have created designated Provider Care Teams.  These Care Teams include your primary Cardiologist (physician) and Advanced Practice Providers (APPs -  Physician Assistants and Nurse Practitioners) who all work together to provide you with the care you need, when you need it.  We recommend signing up for the patient portal called "MyChart".  Sign up information is provided on this After Visit Summary.  MyChart is used to connect with patients for Virtual Visits (Telemedicine).  Patients are able to view lab/test results, encounter notes, upcoming appointments, etc.  Non-urgent messages can be sent to your provider as well.   To learn more about what you can do with MyChart, go to [email or web address].    Your next appointment:   NEXT WEEK NURSE VISIT  WITH  EKG  AND BLOOD PRESSURE  CHECK   MONDAY   1 month(s)  The format for your next appointment:   In Person  Provider:   Tommye Standard, PA-C    Other Instructions    Important Information About Sugar

## 2021-09-05 ENCOUNTER — Ambulatory Visit (INDEPENDENT_AMBULATORY_CARE_PROVIDER_SITE_OTHER): Payer: Medicare HMO | Admitting: Internal Medicine

## 2021-09-05 VITALS — BP 138/70 | HR 77 | Wt 183.0 lb

## 2021-09-05 DIAGNOSIS — I441 Atrioventricular block, second degree: Secondary | ICD-10-CM | POA: Diagnosis not present

## 2021-09-05 DIAGNOSIS — R001 Bradycardia, unspecified: Secondary | ICD-10-CM | POA: Diagnosis not present

## 2021-09-05 NOTE — Progress Notes (Signed)
   Nurse Visit   Date of Encounter: 09/05/2021 ID: ANKUSH GINTZ, DOB Sep 21, 1945, MRN [id number]  PCP:  Aretta Nip, MD   [facility] Providers Cardiologist:  None Electrophysiologist:  Vickie Epley, MD      Visit Details   VS:  BP 138/70 (BP Location: Right Arm)   Pulse 77   Wt 183 lb 0.4 oz (83 kg)   SpO2 98%   BMI 26.64 kg/m  , BMI Body mass index is 26.64 kg/m.  Wt Readings from Last 3 Encounters:  09/05/21 183 lb 0.4 oz (83 kg)  09/01/21 185 lb (83.9 kg)  11/12/19 181 lb (82.1 kg)     Reason for visit: EKG Visit  Performed today: EKG, Vital Signs, medication update Changes (medications, testing, etc.) : Follow up visit from 09/01/21 Length of Visit: 30 minutes    Medications Adjustments/Labs and Tests Ordered: Pt saw Tommye Standard PA-C on 09/01/2021 and was taken off of Diltiazem, and started Amlodipine 5 mg daily for BP management.  Pt has Mobitz 1 Junct rhythm, and can be bradycardic at times.  EKG was performed to assess med change prior to Colonoscopy.   DOD, Dr. Gasper Sells assessed the EKG, 2:1 HB, Mobitz type 1 assessed with PVC present. Dr. Gasper Sells stated no changes to current meds.  Pt will f/u with Jens Som, PA-C on 10/12/21.      Signed, Varney Daily, RN  09/05/2021 2:41 PM

## 2021-09-06 NOTE — Telephone Encounter (Signed)
I have sent a message to Tommye Standard, [facility] to please advise if the pt has been cleared since he has come in for nurse visit as stated in Renee's office note.

## 2021-09-06 NOTE — Telephone Encounter (Signed)
Per Tommye Standard, PAC she has amended her ov note. I have faxed this over to requesting office.

## 2021-09-06 NOTE — Telephone Encounter (Signed)
S/w the pt and he is aware he has been cleared and notes have been faxed to requesting office. Pt said thank you for the call and the help.

## 2021-09-06 NOTE — Telephone Encounter (Signed)
Melissa with Dr. Kathline Magic office is following up. Please provide update on clearance.  Phone#: [phone] Fax#: [phone]

## 2021-10-09 NOTE — Progress Notes (Unsigned)
Cardiology Office Note Date:  10/09/2021  Patient ID:  Philip, Anderson 1945-02-22, MRN [id number] PCP:  Aretta Nip, MD  Cardiologist:  Dr. Virgina Jock Electrophysiologist: Dr. Quentin Ore n    Chief Complaint: pre-op  History of Present Illness: Philip Anderson is a 76 y.o. male with history of HTN, HLD  He was seen by Dr. Virgina Jock 09/18/19 for pre-op evaluation prior to nasal polyp surgery, and an abnormal EKG EKGs then with CHB and SB with junctional escape beat.o symptoms of brady, physically active. His doxazosin was stopped and referred to EP  He saw Dr. Quentin Ore who reviewed all the available EKGs felt to be Mobitz one with a competng junctional rhythm,  without evidence of CHB.  Planned to ETT to evaluate HR/and chronotropic competence.  At his follow up 10/10/19, noted echo which showed normal LV function and no significant valvular abnormalities. He also had an exercise ecg test during which he was able to achieve 85% (peak HR 125bpm) of his maximum predicted heart rate for 7 METS. Did not think he as at increased risk for advanced heart block with anesthesia, though did recommend that he be be on a heart monitor with any procedures with sedation, and suspected his resting/anesthesia rates likely would be in the 40's Recommended annual visit to monitor his conduction system with EKGs  This was his last EP visit He did have his ENT surgery (w/general anesthesia) without noted complications  He comes in today to be seen for Dr. Quentin Ore now pending a colonoscopy requiring cardiology pre-op evaluation  RCRI score is zero, 0.4%  I saw him 09/01/21 He is scheduled for a colonoscopy Wed next week for screening with hx of polyps removed in the past. 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 No SOB He has probably slowed some over the last year or so, but not dramatically since his last visit and has no difficulties with his ADLs Could jog up a flight of stairs without SOB, reports being very active No symptoms of bradycardia or angina Dilt was stopped > amlodipine, planned for f/u EKG  F/u EKG again, unchanged despite stopping the tyadilt. Given no symptoms and chronic finding, no further changes recommended. Felt to be an acceptable candidate for colonoscopy  Colonoscopy apparently canceled by GI or anesthesiologist 2/2 his bradycardia  *** symptoms?? *** no pacer   Past Medical History:  Diagnosis Date   High cholesterol    Hypertension    Second degree AV block, Mobitz type I    EP - Dr. Quentin Ore    Past Surgical History:  Procedure Laterality Date   COLONOSCOPY     MAXILLARY ANTROSTOMY Right 11/12/2019   Procedure: RIGHT ENDOSCOPIC MAXILLARY ANTROSTOMY WITH REMOVAL OF TISSUE;  Surgeon: [name], MD;  Location: [place];  Service: ENT;  Laterality: Right;    Current Outpatient Medications  Medication Sig Dispense Refill   allopurinol (ZYLOPRIM) 100 MG tablet Take 100 mg by mouth daily.     Alpha-Lipoic Acid 600 MG CAPS Take 600 mg by mouth 3 (three) times a week.  amLODipine (NORVASC) 5 MG tablet Take 1 tablet (5 mg total) by mouth daily. 90 tablet 1   APPLE CIDER VINEGAR PO Take 1 capsule by mouth once a week.      atorvastatin (LIPITOR) 20 MG tablet Take 20 mg by mouth daily.     Coenzyme Q10 (COQ10 PO) Take 15 mLs by mouth 3 (three) times a week.     doxazosin (CARDURA) 2 MG tablet Take 2 mg by mouth at bedtime.     magnesium oxide (MAG-OX) 400 MG tablet Take 400 mg by mouth 2 (two) times a week.     Multiple Vitamin  (MULTIVITAMIN WITH MINERALS) TABS tablet Take 1 tablet by mouth daily.     oxymetazoline (AFRIN) 0.05 % nasal spray Place 1 spray into both nostrils daily at 8 pm.      sildenafil (VIAGRA) 100 MG tablet Take 1 tablet by mouth as needed.     No current facility-administered medications for this visit.    Allergies:   Patient has no known allergies.   Social History:  The patient  reports that he has never smoked. He has never used smokeless tobacco. He reports current alcohol use of about 14.0 standard drinks of alcohol per week. He reports that he does not currently use drugs.   Family History:  The patient's family history includes Diabetes in his brother; Heart disease in his father and mother; Hypertension in his brother.  ROS:  Please see the history of present illness.    All other systems are reviewed and otherwise negative.   PHYSICAL EXAM:  VS:  There were no vitals taken for this visit. BMI: There is no height or weight on file to calculate BMI. Well nourished, well developed, in no acute distress HEENT: normocephalic, atraumatic Neck: no JVD, carotid bruits or masses Cardiac:   *** RRR; no significant murmurs, no rubs, or gallops Lungs:   *** CTA b/l, no wheezing, rhonchi or rales Abd: soft, nontender MS: no deformity or atrophy Ext: *** no edema Skin: warm and dry, no rash Neuro:  No gross deficits appreciated Psych: euthymic mood, full affect   EKG:  Done today and reviewed by myself shows  ***Appears essentially unchanged from his last Dr. Quentin Ore called Second degree block with competing junctional rhythm. V rate *** today  10/10/2019 HR was 46   10/07/2019: ETT Blood pressure demonstrated a normal response to exercise. There was no ST segment deviation noted during stress.   Exercise tolerance test with mildly impaired exercise tolerance (4:15); no chest pain, normal blood pressure response, no ST changes, transient Mobitz 1 second-degree AV block in recovery;  negative adequate exercise tolerance test; Duke treadmill score 4.  10/07/2019: TTE  1. Rhythm appears to be sinus rhythm with complete heart block and  junctional escape.   2. Left ventricular ejection fraction, by estimation, is 60 to 65%. Left  ventricular ejection fraction by 3D volume is 64 %. The left ventricle has  normal function. The left ventricle has no regional wall motion  abnormalities. Indeterminate diastolic  filling due to E-A fusion.   3. Right ventricular systolic function is normal. The right ventricular  size is normal. There is mildly elevated pulmonary artery systolic  pressure. The estimated right ventricular systolic pressure is 34.1 mmHg.   4. The mitral valve is grossly normal. Mild mitral valve regurgitation.  No evidence of mitral stenosis.   5. The aortic valve is tricuspid. There is mild calcification of the  aortic valve. Aortic  valve regurgitation is not visualized. Mild aortic  valve sclerosis is present, with no evidence of aortic valve stenosis.   6. The inferior vena cava is normal in size with greater than 50%  respiratory variability, suggesting right atrial pressure of 3 mmHg.   Recent Labs: No results found for requested labs within last 365 days.  No results found for requested labs within last 365 days.   CrCl cannot be calculated (Patient's most recent lab result is older than the maximum 21 days allowed.).   Wt Readings from Last 3 Encounters:  09/05/21 183 lb 0.4 oz (83 kg)  09/01/21 185 lb (83.9 kg)  11/12/19 181 lb (82.1 kg)     Other studies reviewed: Additional studies/records reviewed today include: summarized above  ASSESSMENT AND PLAN:  Mobitz I, junctional rhythm, bradycardia known for him EKGs appear much the same/essentially unchanged from priors ***      Disposition: ***   Current medicines are reviewed at length with the patient today.  The patient did not have any concerns regarding medicines.  Venetia Night, PA-C 10/09/2021 5:05 PM     [address] [place] [address] [phone] (office)  [phone] (fax)

## 2021-10-12 ENCOUNTER — Other Ambulatory Visit: Payer: Self-pay | Admitting: Physician Assistant

## 2021-10-12 ENCOUNTER — Ambulatory Visit: Payer: Medicare HMO | Attending: Physician Assistant | Admitting: Physician Assistant

## 2021-10-12 ENCOUNTER — Encounter: Payer: Self-pay | Admitting: Physician Assistant

## 2021-10-12 ENCOUNTER — Ambulatory Visit (INDEPENDENT_AMBULATORY_CARE_PROVIDER_SITE_OTHER): Payer: Medicare HMO

## 2021-10-12 VITALS — BP 144/82 | HR 59 | Ht 69.0 in | Wt 187.0 lb

## 2021-10-12 DIAGNOSIS — I493 Ventricular premature depolarization: Secondary | ICD-10-CM | POA: Diagnosis not present

## 2021-10-12 DIAGNOSIS — I498 Other specified cardiac arrhythmias: Secondary | ICD-10-CM | POA: Diagnosis not present

## 2021-10-12 DIAGNOSIS — R001 Bradycardia, unspecified: Secondary | ICD-10-CM

## 2021-10-12 DIAGNOSIS — I441 Atrioventricular block, second degree: Secondary | ICD-10-CM

## 2021-10-12 DIAGNOSIS — I10 Essential (primary) hypertension: Secondary | ICD-10-CM | POA: Diagnosis not present

## 2021-10-12 NOTE — Patient Instructions (Addendum)
Medication Instructions:   Your physician recommends that you continue on your current medications as directed. Please refer to the Current Medication list given to you today.  *If you need a refill on your cardiac medications before your next appointment, please call your pharmacy*   Lab Work: Portal   If you have labs (blood work) drawn today and your tests are completely normal, you will receive your results only by: [place] (if you have MyChart) OR A paper copy in the mail If you have any lab test that is abnormal or we need to change your treatment, we will call you to review the results.   Testing/Procedures: Your physician has recommended that you wear an event monitor. Event monitors are medical devices that record the heart's electrical activity. Doctors most often [place] these monitors to diagnose arrhythmias. Arrhythmias are problems with the speed or rhythm of the heartbeat. The monitor is a small, portable device. You can wear one while you do your normal daily activities. This is usually used to diagnose what is causing palpitations/syncope (passing out).     Follow-Up: At [facility], you and your health needs are our priority.  As part of our continuing mission to provide you with exceptional heart care, we have created designated Provider Care Teams.  These Care Teams include your primary Cardiologist (physician) and Advanced Practice Providers (APPs -  Physician Assistants and Nurse Practitioners) who all work together to provide you with the care you need, when you need it.  We recommend signing up for the patient portal called "MyChart".  Sign up information is provided on this After Visit Summary.  MyChart is used to connect with patients for Virtual Visits (Telemedicine).  Patients are able to view lab/test results, encounter notes, upcoming appointments, etc.  Non-urgent messages can be sent to your provider as well.   To learn more about  what you can do with MyChart, go to [email or web address].    Your next appointment:   6 month(s)  The format for your next appointment:   In Person  Provider:   Lars Mage, MD    Other Instructions   Important Information About Sugar

## 2021-10-12 NOTE — Progress Notes (Unsigned)
ZIO XT [id number] from office inventory applied to patient.  Dr. Quentin Ore to read.

## 2021-10-19 DIAGNOSIS — I493 Ventricular premature depolarization: Secondary | ICD-10-CM | POA: Diagnosis not present

## 2021-10-19 DIAGNOSIS — N281 Cyst of kidney, acquired: Secondary | ICD-10-CM | POA: Diagnosis not present

## 2021-10-19 DIAGNOSIS — R9431 Abnormal electrocardiogram [ECG] [EKG]: Secondary | ICD-10-CM | POA: Diagnosis not present

## 2021-10-19 DIAGNOSIS — K1379 Other lesions of oral mucosa: Secondary | ICD-10-CM | POA: Diagnosis not present

## 2021-10-19 DIAGNOSIS — R001 Bradycardia, unspecified: Secondary | ICD-10-CM | POA: Diagnosis not present

## 2021-10-19 DIAGNOSIS — I10 Essential (primary) hypertension: Secondary | ICD-10-CM | POA: Diagnosis not present

## 2021-10-19 DIAGNOSIS — Z08 Encounter for follow-up examination after completed treatment for malignant neoplasm: Secondary | ICD-10-CM | POA: Diagnosis not present

## 2021-10-19 DIAGNOSIS — N289 Disorder of kidney and ureter, unspecified: Secondary | ICD-10-CM | POA: Diagnosis not present

## 2021-10-19 DIAGNOSIS — Z8522 Personal history of malignant neoplasm of nasal cavities, middle ear, and accessory sinuses: Secondary | ICD-10-CM | POA: Diagnosis not present

## 2021-10-19 DIAGNOSIS — Z79899 Other long term (current) drug therapy: Secondary | ICD-10-CM | POA: Diagnosis not present

## 2021-10-19 DIAGNOSIS — I441 Atrioventricular block, second degree: Secondary | ICD-10-CM | POA: Diagnosis not present

## 2021-10-19 DIAGNOSIS — C310 Malignant neoplasm of maxillary sinus: Secondary | ICD-10-CM | POA: Diagnosis not present

## 2021-10-20 DIAGNOSIS — I493 Ventricular premature depolarization: Secondary | ICD-10-CM | POA: Diagnosis not present

## 2021-10-20 DIAGNOSIS — I498 Other specified cardiac arrhythmias: Secondary | ICD-10-CM | POA: Diagnosis not present

## 2021-10-21 ENCOUNTER — Telehealth: Payer: Self-pay | Admitting: Cardiology

## 2021-10-21 DIAGNOSIS — Z8522 Personal history of malignant neoplasm of nasal cavities, middle ear, and accessory sinuses: Secondary | ICD-10-CM | POA: Diagnosis not present

## 2021-10-21 DIAGNOSIS — R432 Parageusia: Secondary | ICD-10-CM | POA: Diagnosis not present

## 2021-10-21 DIAGNOSIS — K117 Disturbances of salivary secretion: Secondary | ICD-10-CM | POA: Diagnosis not present

## 2021-10-21 DIAGNOSIS — Z923 Personal history of irradiation: Secondary | ICD-10-CM | POA: Diagnosis not present

## 2021-10-21 DIAGNOSIS — K1379 Other lesions of oral mucosa: Secondary | ICD-10-CM | POA: Diagnosis not present

## 2021-10-21 DIAGNOSIS — C310 Malignant neoplasm of maxillary sinus: Secondary | ICD-10-CM | POA: Diagnosis not present

## 2021-10-21 DIAGNOSIS — Z9221 Personal history of antineoplastic chemotherapy: Secondary | ICD-10-CM | POA: Diagnosis not present

## 2021-10-21 NOTE — Telephone Encounter (Signed)
Calling with abnormal Zio monitor results. Call transferred

## 2021-10-21 NOTE — Telephone Encounter (Signed)
Philip Anderson with [employer] called to report end of summary Zio.  Pt wore 10/12/21-10/15/21 to assess PVC Burden.  Results show 439 episodes of CHB total of 4 hours. 19 runs of VT.  Report is posted in [place].    Left a message for pt to call back.

## 2021-10-21 NOTE — Telephone Encounter (Signed)
[place], PA reviewed results advised most events occurred prior to 6:30 am maybe nocturnal.  Advised to reach out to pt to determine if symptomatic and route to Dr. Quentin Ore to review.   Called pt X3 finally contacted pt.  Reports is asymptomatic.  Wants to know when next OV will be.  Will route to provider to review.

## 2021-11-01 ENCOUNTER — Encounter: Payer: Self-pay | Admitting: *Deleted

## 2021-11-02 DIAGNOSIS — K1379 Other lesions of oral mucosa: Secondary | ICD-10-CM | POA: Diagnosis not present

## 2021-11-02 DIAGNOSIS — K1329 Other disturbances of oral epithelium, including tongue: Secondary | ICD-10-CM | POA: Diagnosis not present

## 2021-11-02 DIAGNOSIS — Z923 Personal history of irradiation: Secondary | ICD-10-CM | POA: Diagnosis not present

## 2021-11-02 DIAGNOSIS — Z8522 Personal history of malignant neoplasm of nasal cavities, middle ear, and accessory sinuses: Secondary | ICD-10-CM | POA: Diagnosis not present

## 2021-11-02 DIAGNOSIS — Z9221 Personal history of antineoplastic chemotherapy: Secondary | ICD-10-CM | POA: Diagnosis not present

## 2021-11-02 DIAGNOSIS — K1370 Unspecified lesions of oral mucosa: Secondary | ICD-10-CM | POA: Diagnosis not present

## 2021-11-24 ENCOUNTER — Ambulatory Visit: Payer: Medicare HMO | Attending: Cardiology | Admitting: Cardiology

## 2021-11-24 ENCOUNTER — Encounter: Payer: Self-pay | Admitting: Cardiology

## 2021-11-24 VITALS — BP 132/68 | HR 43 | Ht 69.0 in | Wt 187.0 lb

## 2021-11-24 DIAGNOSIS — I10 Essential (primary) hypertension: Secondary | ICD-10-CM | POA: Diagnosis not present

## 2021-11-24 DIAGNOSIS — I441 Atrioventricular block, second degree: Secondary | ICD-10-CM

## 2021-11-24 NOTE — Patient Instructions (Signed)
Medication Instructions:  None  *If you need a refill on your cardiac medications before your next appointment, please call your pharmacy*   Lab Work: None  If you have labs (blood work) drawn today and your tests are completely normal, you will receive your results only by: [place] (if you have MyChart) OR A paper copy in the mail If you have any lab test that is abnormal or we need to change your treatment, we will call you to review the results.   Testing/Procedures: None    Follow-Up: At [facility], you and your health needs are our priority.  As part of our continuing mission to provide you with exceptional heart care, we have created designated Provider Care Teams.  These Care Teams include your primary Cardiologist (physician) and Advanced Practice Providers (APPs -  Physician Assistants and Nurse Practitioners) who all work together to provide you with the care you need, when you need it.  We recommend signing up for the patient portal called "MyChart".  Sign up information is provided on this After Visit Summary.  MyChart is used to connect with patients for Virtual Visits (Telemedicine).  Patients are able to view lab/test results, encounter notes, upcoming appointments, etc.  Non-urgent messages can be sent to your provider as well.   To learn more about what you can do with MyChart, go to [email or web address].    Your next appointment:   1 year(s)  The format for your next appointment:   In Person  Provider:   You will see one of the following Advanced Practice Providers on your designated Care Team:   Tommye Standard, [place] Legrand Como "Jonni Sanger" Chalmers Cater, [place]      Other Instructions None   Important Information About Sugar

## 2021-11-24 NOTE — Progress Notes (Deleted)
Electrophysiology Office Follow up Visit Note:    Date:  11/24/2021   ID:  Booker, Bhatnagar December 31, 1945, MRN [id number]  PCP:  Aretta Nip, MD  [place] Cardiologist:  None  [facility] Electrophysiologist:  Vickie Epley, MD    Interval History:    JENESIS SUCHY is a 76 y.o. male who presents for a follow up visit. They were last seen in clinic 10/10/2019 for asymptomatic heart block. Based on our evaluation he is thought to have Mobitz I (nodal) block rather than infrahisian block. He has good chronotropy.  He saw Renee on 10/12/2021. He is pending a colonoscopy but this was cancelled due to bradycardia.  He presents today for routine follow up.        Past Medical History:  Diagnosis Date   High cholesterol    Hypertension    Second degree AV block, Mobitz type I    EP - Dr. Quentin Ore    Past Surgical History:  Procedure Laterality Date   COLONOSCOPY     MAXILLARY ANTROSTOMY Right 11/12/2019   Procedure: RIGHT ENDOSCOPIC MAXILLARY ANTROSTOMY WITH REMOVAL OF TISSUE;  Surgeon: [name], MD;  Location[facility];  Service: ENT;  Laterality: Right;    Current Medications: No outpatient medications have been marked as taking for the 11/24/21 encounter (Appointment) with Vickie Epley, MD.     Allergies:   Patient has no known allergies.   Social History   Socioeconomic History   Marital status: Married    Spouse name: Not on file   Number of children: 0   Years of education: Not on file   Highest education level: Not on file  Occupational History   Not on file  Tobacco Use   Smoking status: Never   Smokeless tobacco: Never  Vaping Use   Vaping Use: Never used  Substance and Sexual Activity   Alcohol use: Yes    Alcohol/week: 14.0 standard drinks of alcohol    Types: 14 Standard drinks or equivalent per week   Drug use: Not Currently   Sexual activity: Not on file  Other Topics Concern   Not on file  Social History Narrative    Not on file   Social Determinants of Health   Financial Resource Strain: Not on file  Food Insecurity: Not on file  Transportation Needs: Not on file  Physical Activity: Not on file  Stress: Not on file  Social Connections: Not on file     Family History: The patient's family history includes Diabetes in his brother; Heart disease in his father and mother; Hypertension in his brother.  ROS:   Please see the history of present illness.    All other systems reviewed and are negative.  EKGs/Labs/Other Studies Reviewed:    The following studies were reviewed today:  10/20/2021 Zio personally reviewed HR 29 - 210 bpm, average 48 bpm. 19 nonsustained VT, longest 4 beats. 439 episodes of AV block. Some of these episodes appear to be sinus bradycardia with a competing junctional rhythm. Frequent ventricular ectopy, 9.8%. Rare supraventricular ectopy. No atrial fibrillation.     EKG:  The ekg ordered today demonstrates ***  Recent Labs: No results found for requested labs within last 365 days.  Recent Lipid Panel No results found for: "CHOL", "TRIG", "HDL", "CHOLHDL", "VLDL", "LDLCALC", "LDLDIRECT"  Physical Exam:    VS:  There were no vitals taken for this visit.    Wt Readings from Last 3 Encounters:  10/12/21 187  lb (84.8 kg)  09/05/21 183 lb 0.4 oz (83 kg)  09/01/21 185 lb (83.9 kg)     GEN: *** Well nourished, well developed in no acute distress HEENT: Normal NECK: No JVD; No carotid bruits LYMPHATICS: No lymphadenopathy CARDIAC: ***RRR, no murmurs, rubs, gallops RESPIRATORY:  Clear to auscultation without rales, wheezing or rhonchi  ABDOMEN: Soft, non-tender, non-distended MUSCULOSKELETAL:  No edema; No deformity  SKIN: Warm and dry NEUROLOGIC:  Alert and oriented x 3 PSYCHIATRIC:  Normal affect        ASSESSMENT:    No diagnosis found. PLAN:    In order of problems listed above:   #Mobitz I HB #Bradycardia Excellent chronotropy. Has  consistently demonstrated good exercise capacity. No current indication for permanent pacing. I have recommended he discuss this with his GI doctor and anaesthesiologist. I do think he could undergo a colonoscopy in a closely monitored setting. He should have pacing pads on during sedation.  #Hypertension *** goal today.  Recommend checking blood pressures 1-2 times per week at home and recording the values.  Recommend bringing these recordings to the primary care physician.    Follow up 1 year        Total time spent with patient today *** minutes. This includes reviewing records, evaluating the patient and coordinating care.   Medication Adjustments/Labs and Tests Ordered: Current medicines are reviewed at length with the patient today.  Concerns regarding medicines are outlined above.  No orders of the defined types were placed in this encounter.  No orders of the defined types were placed in this encounter.    Signed, Lars Mage, MD, [facility], [facility] 11/24/2021 5:29 AM    Electrophysiology [facility]

## 2021-11-24 NOTE — Progress Notes (Signed)
Electrophysiology Office Follow up Visit Note:    Date:  11/24/2021   ID:  Philip Anderson, Philip Anderson [birth year], MRN [id number]  PCP:  Aretta Nip, MD  [place] Cardiologist:  None  [facility] Electrophysiologist:  Vickie Epley, MD    Interval History:    Philip Anderson is a 76 y.o. male who presents for a follow up visit. They were last seen in clinic 10/10/2019 for asymptomatic heart block. Based on our evaluation he is thought to have Mobitz I (nodal) block rather than infrahisian block. He has good chronotropy.  He saw Renee on 10/12/2021. He is pending a colonoscopy but this was cancelled due to bradycardia.  He presents today for routine follow up.   Today, he states he is feeling good relatively speaking. No issues with dizziness, fainting, or lightheadedness.  In clinic today his pulse was initially 43 bpm. After walking him around the office at a brisk pace for 2 minutes, his heart rate was 60-70 bpm.   For activity he is able to walk 1-2 miles without significant symptoms. He also completes his household chores without issues. If he does overexert with a lot of pushing or pulling he will become fatigued as he expects.  He believes that he may be asked to have his colonoscopy in the outpatient hospital setting. The last time he needed anesthesia was during his maxillary antrostomy in 10/2019.   He denies any palpitations, chest pain, shortness of breath, or peripheral edema. No headaches, orthopnea, or PND.      Past Medical History:  Diagnosis Date   High cholesterol    Hypertension    Second degree AV block, Mobitz type I    EP - Dr. Quentin Ore    Past Surgical History:  Procedure Laterality Date   COLONOSCOPY     MAXILLARY ANTROSTOMY Right 11/12/2019   Procedure: RIGHT ENDOSCOPIC MAXILLARY ANTROSTOMY WITH REMOVAL OF TISSUE;  Surgeon: [name], MD;  Location: [place];  Service: ENT;  Laterality: Right;    Current Medications: Current Meds   Medication Sig   allopurinol (ZYLOPRIM) 100 MG tablet Take 100 mg by mouth daily.   Alpha-Lipoic Acid 600 MG CAPS Take 600 mg by mouth 3 (three) times a week.   amLODipine (NORVASC) 5 MG tablet Take 1 tablet (5 mg total) by mouth daily.   APPLE CIDER VINEGAR PO Take 1 capsule by mouth once a week.    atorvastatin (LIPITOR) 20 MG tablet Take 20 mg by mouth daily.   Coenzyme Q10 (COQ10 PO) Take 15 mLs by mouth 3 (three) times a week.   doxazosin (CARDURA) 2 MG tablet Take 2 mg by mouth at bedtime.   magnesium oxide (MAG-OX) 400 MG tablet Take 400 mg by mouth 2 (two) times a week.   Multiple Vitamin (MULTIVITAMIN WITH MINERALS) TABS tablet Take 1 tablet by mouth daily.   sildenafil (VIAGRA) 100 MG tablet Take 1 tablet by mouth as needed.     Allergies:   Cisplatin and Atorvastatin   Social History   Socioeconomic History   Marital status: Married    Spouse name: Not on file   Number of children: 0   Years of education: Not on file   Highest education level: Not on file  Occupational History   Not on file  Tobacco Use   Smoking status: Never   Smokeless tobacco: Never  Vaping Use   Vaping Use: Never used  Substance and Sexual Activity   Alcohol use: Yes  Alcohol/week: 14.0 standard drinks of alcohol    Types: 14 Standard drinks or equivalent per week   Drug use: Not Currently   Sexual activity: Not on file  Other Topics Concern   Not on file  Social History Narrative   Not on file   Social Determinants of Health   Financial Resource Strain: Not on file  Food Insecurity: Not on file  Transportation Needs: Not on file  Physical Activity: Not on file  Stress: Not on file  Social Connections: Not on file     Family History: The patient's family history includes Diabetes in his brother; Heart disease in his father and mother; Hypertension in his brother.  ROS:   Please see the history of present illness.    All other systems reviewed and are  negative.  EKGs/Labs/Other Studies Reviewed:    The following studies were reviewed today:  10/20/2021 Zio personally reviewed HR 29 - 210 bpm, average 48 bpm. 19 nonsustained VT, longest 4 beats. 439 episodes of AV block. Some of these episodes appear to be sinus bradycardia with a competing junctional rhythm. Frequent ventricular ectopy, 9.8%. Rare supraventricular ectopy. No atrial fibrillation.   EKG:  The ekg ordered today demonstrates sinus rhythm, mobitz I and a competing junctional rhythm and frequent PVCs (bigeminal)  Recent Labs: No results found for requested labs within last 365 days.   Recent Lipid Panel No results found for: "CHOL", "TRIG", "HDL", "CHOLHDL", "VLDL", "LDLCALC", "LDLDIRECT"  Physical Exam:    VS:  BP 132/68   Pulse (!) 43   Ht '5\' 9"'$  (1.753 m)   Wt 187 lb (84.8 kg)   SpO2 96%   BMI 27.62 kg/m     Wt Readings from Last 3 Encounters:  11/24/21 187 lb (84.8 kg)  10/12/21 187 lb (84.8 kg)  09/05/21 183 lb 0.4 oz (83 kg)     GEN: Well nourished, well developed in no acute distress HEENT: Normal NECK: No JVD; No carotid bruits LYMPHATICS: No lymphadenopathy CARDIAC: regular rhythm, bradycardic, no murmurs, rubs, gallops RESPIRATORY:  Clear to auscultation without rales, wheezing or rhonchi  ABDOMEN: Soft, non-tender, non-distended MUSCULOSKELETAL:  No edema; No deformity  SKIN: Warm and dry NEUROLOGIC:  Alert and oriented x 3 PSYCHIATRIC:  Normal affect       ASSESSMENT:    1. Mobitz type 1 second degree atrioventricular block   2. Primary hypertension    PLAN:    In order of problems listed above:  #Mobitz I HB #Bradycardia Excellent chronotropy. Has consistently demonstrated good exercise capacity. No current indication for permanent pacing. He is very clear in his wishes to avoid pacemaker if at all possible. I have recommended he discuss this with his GI doctor and anaesthesiologist. I do think he could undergo a colonoscopy in  a closely monitored (in-hospital) setting. He should have pacing pads on during sedation. He has recently undergone a nasal surgery under anaesthesia without complication.  We again discussed pacing in detail today. We discussed the indications for pacing and symptoms that should prompt immediate presentation to the ER.  #Hypertension At goal today.  Recommend checking blood pressures 1-2 times per week at home and recording the values.  Recommend bringing these recordings to the primary care physician.  Follow up in 1 year w APP.   Medication Adjustments/Labs and Tests Ordered: Current medicines are reviewed at length with the patient today.  Concerns regarding medicines are outlined above.   No orders of the defined types were placed in  this encounter.  No orders of the defined types were placed in this encounter.  I,Mathew Stumpf,acting as a Education administrator for Vickie Epley, MD.,have documented all relevant documentation on the behalf of Vickie Epley, MD,as directed by  Vickie Epley, MD while in the presence of Vickie Epley, MD.  I, Vickie Epley, MD, have reviewed all documentation for this visit. The documentation on 11/24/21 for the exam, diagnosis, procedures, and orders are all accurate and complete.   Signed, Lars Mage, MD, [facility], [facility] 11/24/2021 12:31 PM    Electrophysiology [facility]

## 2021-11-25 DIAGNOSIS — K1379 Other lesions of oral mucosa: Secondary | ICD-10-CM | POA: Diagnosis not present

## 2021-11-25 DIAGNOSIS — K1230 Oral mucositis (ulcerative), unspecified: Secondary | ICD-10-CM | POA: Diagnosis not present

## 2022-02-14 ENCOUNTER — Other Ambulatory Visit: Payer: Self-pay | Admitting: Physician Assistant

## 2022-02-21 DIAGNOSIS — H903 Sensorineural hearing loss, bilateral: Secondary | ICD-10-CM | POA: Diagnosis not present

## 2022-04-19 DIAGNOSIS — Z136 Encounter for screening for cardiovascular disorders: Secondary | ICD-10-CM | POA: Diagnosis not present

## 2022-04-19 DIAGNOSIS — C310 Malignant neoplasm of maxillary sinus: Secondary | ICD-10-CM | POA: Diagnosis not present

## 2022-04-19 DIAGNOSIS — Z923 Personal history of irradiation: Secondary | ICD-10-CM | POA: Diagnosis not present

## 2022-04-19 DIAGNOSIS — Z8589 Personal history of malignant neoplasm of other organs and systems: Secondary | ICD-10-CM | POA: Diagnosis not present

## 2022-04-19 DIAGNOSIS — Z9221 Personal history of antineoplastic chemotherapy: Secondary | ICD-10-CM | POA: Diagnosis not present

## 2022-04-19 DIAGNOSIS — N289 Disorder of kidney and ureter, unspecified: Secondary | ICD-10-CM | POA: Diagnosis not present

## 2022-04-19 DIAGNOSIS — N281 Cyst of kidney, acquired: Secondary | ICD-10-CM | POA: Diagnosis not present

## 2022-04-19 DIAGNOSIS — R432 Parageusia: Secondary | ICD-10-CM | POA: Diagnosis not present

## 2022-04-19 DIAGNOSIS — Z9089 Acquired absence of other organs: Secondary | ICD-10-CM | POA: Diagnosis not present

## 2022-04-19 DIAGNOSIS — R911 Solitary pulmonary nodule: Secondary | ICD-10-CM | POA: Diagnosis not present

## 2022-04-19 DIAGNOSIS — K117 Disturbances of salivary secretion: Secondary | ICD-10-CM | POA: Diagnosis not present

## 2022-04-19 DIAGNOSIS — Z9009 Acquired absence of other part of head and neck: Secondary | ICD-10-CM | POA: Diagnosis not present

## 2022-06-13 DIAGNOSIS — Z0000 Encounter for general adult medical examination without abnormal findings: Secondary | ICD-10-CM | POA: Diagnosis not present

## 2022-06-13 DIAGNOSIS — Z1389 Encounter for screening for other disorder: Secondary | ICD-10-CM | POA: Diagnosis not present

## 2022-06-13 DIAGNOSIS — E663 Overweight: Secondary | ICD-10-CM | POA: Diagnosis not present

## 2022-06-13 DIAGNOSIS — Z23 Encounter for immunization: Secondary | ICD-10-CM | POA: Diagnosis not present

## 2022-06-21 DIAGNOSIS — E7800 Pure hypercholesterolemia, unspecified: Secondary | ICD-10-CM | POA: Diagnosis not present

## 2022-06-21 DIAGNOSIS — F1020 Alcohol dependence, uncomplicated: Secondary | ICD-10-CM | POA: Diagnosis not present

## 2022-06-21 DIAGNOSIS — S30861A Insect bite (nonvenomous) of abdominal wall, initial encounter: Secondary | ICD-10-CM | POA: Diagnosis not present

## 2022-06-21 DIAGNOSIS — I10 Essential (primary) hypertension: Secondary | ICD-10-CM | POA: Diagnosis not present

## 2022-06-21 DIAGNOSIS — M109 Gout, unspecified: Secondary | ICD-10-CM | POA: Diagnosis not present

## 2022-06-21 DIAGNOSIS — N529 Male erectile dysfunction, unspecified: Secondary | ICD-10-CM | POA: Diagnosis not present

## 2022-06-21 DIAGNOSIS — N1832 Chronic kidney disease, stage 3b: Secondary | ICD-10-CM | POA: Diagnosis not present

## 2022-06-21 DIAGNOSIS — E785 Hyperlipidemia, unspecified: Secondary | ICD-10-CM | POA: Diagnosis not present

## 2022-09-26 DIAGNOSIS — K449 Diaphragmatic hernia without obstruction or gangrene: Secondary | ICD-10-CM | POA: Diagnosis not present

## 2022-09-26 DIAGNOSIS — N281 Cyst of kidney, acquired: Secondary | ICD-10-CM | POA: Diagnosis not present

## 2022-09-26 DIAGNOSIS — R911 Solitary pulmonary nodule: Secondary | ICD-10-CM | POA: Diagnosis not present

## 2022-10-10 ENCOUNTER — Other Ambulatory Visit: Payer: Self-pay | Admitting: Gastroenterology

## 2022-10-18 ENCOUNTER — Telehealth: Payer: Self-pay | Admitting: *Deleted

## 2022-10-18 DIAGNOSIS — C310 Malignant neoplasm of maxillary sinus: Secondary | ICD-10-CM | POA: Diagnosis not present

## 2022-10-18 DIAGNOSIS — Z8589 Personal history of malignant neoplasm of other organs and systems: Secondary | ICD-10-CM | POA: Diagnosis not present

## 2022-10-18 DIAGNOSIS — Z923 Personal history of irradiation: Secondary | ICD-10-CM | POA: Diagnosis not present

## 2022-10-18 NOTE — Telephone Encounter (Signed)
Pt is scheduled to see Otilio Saber, NP, 11/22/22, clearance will be addressed at that time.  Will route to requesting surgeon's office to make them aware.

## 2022-10-18 NOTE — Telephone Encounter (Signed)
Preop Team, Mr. Philip Anderson has a OV on 11/22/22. I will defer cardiac evaluation to upcoming appt. Please add preop cardiac eval to his appt. Notes. I will remove pt for the pre op pool. Thanks you for your help.   Thomasene Ripple. Orry Sigl NP-C     10/18/2022, 12:51 PM [facility] [address] Office [phone] Fax [phone]

## 2022-10-18 NOTE — Telephone Encounter (Signed)
Pre-operative Risk Assessment    Patient Name: [name]  DOB: 08/25/1945 MRN: [id number]   LAST O/V:  01/2021 SCHEDULED O/V: 11/22/22  Request for Surgical Clearance    Procedure:   COLONOSCOPY  Date of Surgery:  Clearance 12/12/22                                 Surgeon:  DR. Charlott Rakes Surgeon's Group or Practice Name:  [facility] Phone number:  [phone] Fax number:  [phone]   Type of Clearance Requested:   - Medical    Type of Anesthesia:   PROPOFOL   Additional requests/questions:    Wilhemina Cash   10/18/2022, 12:15 PM

## 2022-10-25 DIAGNOSIS — Z9889 Other specified postprocedural states: Secondary | ICD-10-CM | POA: Diagnosis not present

## 2022-10-25 DIAGNOSIS — Z923 Personal history of irradiation: Secondary | ICD-10-CM | POA: Diagnosis not present

## 2022-10-25 DIAGNOSIS — R682 Dry mouth, unspecified: Secondary | ICD-10-CM | POA: Diagnosis not present

## 2022-10-25 DIAGNOSIS — C310 Malignant neoplasm of maxillary sinus: Secondary | ICD-10-CM | POA: Diagnosis not present

## 2022-10-25 DIAGNOSIS — Z8522 Personal history of malignant neoplasm of nasal cavities, middle ear, and accessory sinuses: Secondary | ICD-10-CM | POA: Diagnosis not present

## 2022-10-25 DIAGNOSIS — Z9221 Personal history of antineoplastic chemotherapy: Secondary | ICD-10-CM | POA: Diagnosis not present

## 2022-10-25 DIAGNOSIS — Z8589 Personal history of malignant neoplasm of other organs and systems: Secondary | ICD-10-CM | POA: Diagnosis not present

## 2022-11-08 DIAGNOSIS — N289 Disorder of kidney and ureter, unspecified: Secondary | ICD-10-CM | POA: Diagnosis not present

## 2022-11-08 DIAGNOSIS — Z08 Encounter for follow-up examination after completed treatment for malignant neoplasm: Secondary | ICD-10-CM | POA: Diagnosis not present

## 2022-11-08 DIAGNOSIS — C310 Malignant neoplasm of maxillary sinus: Secondary | ICD-10-CM | POA: Diagnosis not present

## 2022-11-08 DIAGNOSIS — Z8589 Personal history of malignant neoplasm of other organs and systems: Secondary | ICD-10-CM | POA: Diagnosis not present

## 2022-11-21 NOTE — Progress Notes (Unsigned)
Electrophysiology Office Note:   Date:  11/22/2022  ID:  [name], Ondo Jul 25, 1945, MRN [id number]  Primary Cardiologist: None Electrophysiologist: Lanier Prude, MD      History of Present Illness:   Philip Anderson is a 77 y.o. male with h/o asymptomatic heart block seen today for cardiac clearance for colonoscopy   Since last being seen in our clinic the patient reports doing very well. No changes. His resting HR is 40s, occasionally dips into 30s, asymptomatic. Historically has risen to 50-70s with activity. He denies chest pain, palpitations, dyspnea, PND, orthopnea, nausea, vomiting, dizziness, syncope, edema, weight gain, or early satiety.  Review of systems complete and found to be negative unless listed in HPI.   EP Information / Studies Reviewed:    EKG is ordered today. Personal review as below.  EKG Interpretation Date/Time:  Wednesday November 22 2022 09:57:20 EDT Ventricular Rate:  56 PR Interval:    QRS Duration:  74 QT Interval:  446 QTC Calculation: 430 R Axis:   -20  Text Interpretation: Normal sinus rhythm Mobitz I 2-degree AV block (Wenckebach block) Premature ventricular complexes Inferior infarct , age undetermined When compared with ECG of 16-Sep-2019 10:01, No significant change was found Confirmed by Maxine Glenn [phone]) on 11/22/2022 10:03:55 AM    Monitor 09/2021 HR 29 - 210 bpm, average 48 bpm. 19 nonsustained VT, longest 4 beats. 439 episodes of AV block. Some of these episodes appear to be sinus bradycardia with a competing junctional rhythm. Frequent ventricular ectopy, 9.8%. Rare supraventricular ectopy. No atrial fibrillation.   Echo 09/2019 LVEF 60-65%, normal RV, mild MR  ETT 09/2019 Blood pressure demonstrated a normal response to exercise. There was no ST segment deviation noted during stress.   Exercise tolerance test with mildly impaired exercise tolerance (4:15); no chest pain, normal blood pressure response, no ST changes,  transient Mobitz 1 second-degree AV block in recovery; negative adequate exercise tolerance test; Duke treadmill score 4.  Physical Exam:   VS:  BP (!) 156/72   Pulse (!) 56   Ht 5\' 9"  (1.753 m)   Wt 187 lb 3.2 oz (84.9 kg)   SpO2 97%   BMI 27.64 kg/m    Wt Readings from Last 3 Encounters:  11/22/22 187 lb 3.2 oz (84.9 kg)  11/24/21 187 lb (84.8 kg)  10/12/21 187 lb (84.8 kg)     GEN: Well nourished, well developed in no acute distress NECK: No JVD; No carotid bruits CARDIAC: Regular rate and rhythm with occasional ectopy/missed beats, no murmurs, rubs, gallops RESPIRATORY:  Clear to auscultation without rales, wheezing or rhonchi  ABDOMEN: Soft, non-tender, non-distended EXTREMITIES:  No edema; No deformity   ASSESSMENT AND PLAN:    Mobitz 1 HB Bradycardia Historically has had excellent chronotropy and has demonstrated good exercise capacity.  He is very clear in his wishes to avoid pacemaker if at all possible.   HTN Stable on current regimen   Cardiac clearance for Colonoscopy As previously, we think he could undergo a colonoscopy under closely monitored (in-hospital) setting.  He should have pacing pads on during sedation He previously underwent a nasal surgery under anasthesia without complication.     Follow up with EP APP in 6 months  Signed, Graciella Freer, PA-C

## 2022-11-22 ENCOUNTER — Ambulatory Visit: Payer: Medicare HMO | Attending: Student | Admitting: Student

## 2022-11-22 ENCOUNTER — Encounter: Payer: Self-pay | Admitting: Student

## 2022-11-22 VITALS — BP 156/72 | HR 56 | Ht 69.0 in | Wt 187.2 lb

## 2022-11-22 DIAGNOSIS — I441 Atrioventricular block, second degree: Secondary | ICD-10-CM | POA: Diagnosis not present

## 2022-11-22 DIAGNOSIS — I10 Essential (primary) hypertension: Secondary | ICD-10-CM | POA: Diagnosis not present

## 2022-11-22 NOTE — Patient Instructions (Signed)
Medication Instructions:  Your physician recommends that you continue on your current medications as directed. Please refer to the Current Medication list given to you today.  *If you need a refill on your cardiac medications before your next appointment, please call your pharmacy*  Lab Work: None ordered If you have labs (blood work) drawn today and your tests are completely normal, you will receive your results only by: MyChart Message (if you have MyChart) OR A paper copy in the mail If you have any lab test that is abnormal or we need to change your treatment, we will call you to review the results  Follow-Up: At [facility], you and your health needs are our priority.  As part of our continuing mission to provide you with exceptional heart care, we have created designated Provider Care Teams.  These Care Teams include your primary Cardiologist (physician) and Advanced Practice Providers (APPs -  Physician Assistants and Nurse Practitioners) who all work together to provide you with the care you need, when you need it.  Your next appointment:   6 month(s)  Provider:   Michael "Andy" Tillery, PA-C  

## 2022-12-05 ENCOUNTER — Encounter (HOSPITAL_COMMUNITY): Payer: Self-pay | Admitting: Gastroenterology

## 2022-12-05 NOTE — Progress Notes (Signed)
Attempted to obtain medical history for pre op call via telephone, unable to reach at this time. HIPAA compliant voicemail message left requesting return call to pre surgical testing department.

## 2022-12-11 NOTE — Anesthesia Preprocedure Evaluation (Signed)
Anesthesia Evaluation  Patient identified by MRN, date of birth, ID band Patient awake    Reviewed: Allergy & Precautions, H&P , NPO status , Patient's Chart, lab work & pertinent test results  Airway Mallampati: II  TM Distance: >3 FB Neck ROM: Full    Dental no notable dental hx. (+) Teeth Intact, Dental Advisory Given   Pulmonary neg pulmonary ROS   Pulmonary exam normal breath sounds clear to auscultation       Cardiovascular Exercise Tolerance: Good hypertension, Pt. on medications + dysrhythmias  Rhythm:Regular Rate:Normal     Neuro/Psych negative neurological ROS  negative psych ROS   GI/Hepatic negative GI ROS, Neg liver ROS,,,  Endo/Other  negative endocrine ROS    Renal/GU negative Renal ROS  negative genitourinary   Musculoskeletal   Abdominal   Peds  Hematology negative hematology ROS (+)   Anesthesia Other Findings   Reproductive/Obstetrics negative OB ROS                             Anesthesia Physical Anesthesia Plan  ASA: 3  Anesthesia Plan: MAC   Post-op Pain Management: Minimal or no pain anticipated   Induction: Intravenous  PONV Risk Score and Plan: 1 and Propofol infusion  Airway Management Planned: Natural Airway and Simple Face Mask  Additional Equipment:   Intra-op Plan:   Post-operative Plan:   Informed Consent: I have reviewed the patients History and Physical, chart, labs and discussed the procedure including the risks, benefits and alternatives for the proposed anesthesia with the patient or authorized representative who has indicated his/her understanding and acceptance.     Dental advisory given  Plan Discussed with: CRNA  Anesthesia Plan Comments:        Anesthesia Quick Evaluation

## 2022-12-12 ENCOUNTER — Other Ambulatory Visit: Payer: Self-pay

## 2022-12-12 ENCOUNTER — Encounter (HOSPITAL_COMMUNITY)
Admission: RE | Disposition: A | Discharge status: Home / Self Care | Payer: Self-pay | Source: Home / Self Care | Attending: Gastroenterology

## 2022-12-12 ENCOUNTER — Encounter (HOSPITAL_COMMUNITY): Payer: Self-pay | Admitting: Gastroenterology

## 2022-12-12 ENCOUNTER — Ambulatory Visit (HOSPITAL_COMMUNITY): Payer: Medicare HMO | Admitting: Medical

## 2022-12-12 ENCOUNTER — Ambulatory Visit (HOSPITAL_BASED_OUTPATIENT_CLINIC_OR_DEPARTMENT_OTHER): Payer: Medicare HMO | Admitting: Medical

## 2022-12-12 ENCOUNTER — Ambulatory Visit (HOSPITAL_COMMUNITY)
Admission: RE | Admit: 2022-12-12 | Discharge: 2022-12-12 | Disposition: A | Discharge status: Home / Self Care | Payer: Medicare HMO | Attending: Gastroenterology | Admitting: Gastroenterology

## 2022-12-12 DIAGNOSIS — K621 Rectal polyp: Secondary | ICD-10-CM | POA: Diagnosis not present

## 2022-12-12 DIAGNOSIS — Z860100 Personal history of colon polyps, unspecified: Secondary | ICD-10-CM | POA: Diagnosis not present

## 2022-12-12 DIAGNOSIS — K5730 Diverticulosis of large intestine without perforation or abscess without bleeding: Secondary | ICD-10-CM | POA: Insufficient documentation

## 2022-12-12 DIAGNOSIS — D124 Benign neoplasm of descending colon: Secondary | ICD-10-CM | POA: Insufficient documentation

## 2022-12-12 DIAGNOSIS — D125 Benign neoplasm of sigmoid colon: Secondary | ICD-10-CM | POA: Insufficient documentation

## 2022-12-12 DIAGNOSIS — Z860101 Personal history of adenomatous and serrated colon polyps: Secondary | ICD-10-CM

## 2022-12-12 DIAGNOSIS — K635 Polyp of colon: Secondary | ICD-10-CM

## 2022-12-12 DIAGNOSIS — K640 First degree hemorrhoids: Secondary | ICD-10-CM | POA: Diagnosis not present

## 2022-12-12 DIAGNOSIS — D128 Benign neoplasm of rectum: Secondary | ICD-10-CM | POA: Insufficient documentation

## 2022-12-12 DIAGNOSIS — Z1211 Encounter for screening for malignant neoplasm of colon: Secondary | ICD-10-CM | POA: Diagnosis not present

## 2022-12-12 DIAGNOSIS — I10 Essential (primary) hypertension: Secondary | ICD-10-CM | POA: Diagnosis not present

## 2022-12-12 DIAGNOSIS — D127 Benign neoplasm of rectosigmoid junction: Secondary | ICD-10-CM | POA: Diagnosis not present

## 2022-12-12 DIAGNOSIS — D126 Benign neoplasm of colon, unspecified: Secondary | ICD-10-CM

## 2022-12-12 DIAGNOSIS — Z09 Encounter for follow-up examination after completed treatment for conditions other than malignant neoplasm: Secondary | ICD-10-CM | POA: Diagnosis not present

## 2022-12-12 HISTORY — PX: HEMOSTASIS CLIP PLACEMENT: SHX6857

## 2022-12-12 HISTORY — PX: COLONOSCOPY WITH PROPOFOL: SHX5780

## 2022-12-12 HISTORY — PX: POLYPECTOMY: SHX5525

## 2022-12-12 SURGERY — COLONOSCOPY WITH PROPOFOL
Anesthesia: Monitor Anesthesia Care

## 2022-12-12 MED ORDER — EPHEDRINE SULFATE-NACL 50-0.9 MG/10ML-% IV SOSY
PREFILLED_SYRINGE | INTRAVENOUS | Status: DC | PRN
  Administered 2022-12-12 (×3): 5 mg via INTRAVENOUS

## 2022-12-12 MED ORDER — PROPOFOL 10 MG/ML IV BOLUS
INTRAVENOUS | Status: DC | PRN
  Administered 2022-12-12: 80 mg via INTRAVENOUS
  Administered 2022-12-12 (×4): 30 mg via INTRAVENOUS

## 2022-12-12 MED ORDER — GLYCOPYRROLATE 0.2 MG/ML IJ SOLN
INTRAMUSCULAR | Status: DC | PRN
  Administered 2022-12-12 (×2): .1 mg via INTRAVENOUS

## 2022-12-12 MED ORDER — PROPOFOL 500 MG/50ML IV EMUL
INTRAVENOUS | Status: DC | PRN
  Administered 2022-12-12: 85 ug/kg/min via INTRAVENOUS

## 2022-12-12 MED ORDER — SODIUM CHLORIDE 0.9 % IV SOLN: INTRAVENOUS | Status: DC

## 2022-12-12 MED ORDER — PROPOFOL 500 MG/50ML IV EMUL
INTRAVENOUS | Status: AC
  Filled 2022-12-12: qty 50

## 2022-12-12 MED ORDER — PROPOFOL 10 MG/ML IV BOLUS
INTRAVENOUS | Status: AC
  Filled 2022-12-12: qty 20

## 2022-12-12 SURGICAL SUPPLY — 20 items

## 2022-12-12 NOTE — Interval H&P Note (Signed)
History and Physical Interval Note:  12/12/2022 8:22 AM  Philip Anderson  has presented today for surgery, with the diagnosis of History of colon polyps.  The various methods of treatment have been discussed with the patient and family. After consideration of risks, benefits and other options for treatment, the patient has consented to  Procedure(s) with comments: COLONOSCOPY WITH PROPOFOL (N/A) - Cardiology recommends that patient have pacing pads on during sedation at the hospital. as a surgical intervention.  The patient's history has been reviewed, patient examined, no change in status, stable for surgery.  I have reviewed the patient's chart and labs.  Questions were answered to the patient's satisfaction.     Shirley Friar

## 2022-12-12 NOTE — Anesthesia Postprocedure Evaluation (Signed)
Anesthesia Post Note  Patient: Philip Anderson  Procedure(s) Performed: COLONOSCOPY WITH PROPOFOL HEMOSTASIS CLIP PLACEMENT POLYPECTOMY     Patient location during evaluation: Endoscopy Anesthesia Type: MAC Level of consciousness: awake and alert Pain management: pain level controlled Vital Signs Assessment: post-procedure vital signs reviewed and stable Respiratory status: spontaneous breathing, nonlabored ventilation and respiratory function stable Cardiovascular status: stable and blood pressure returned to baseline Postop Assessment: no apparent nausea or vomiting Anesthetic complications: no  No notable events documented.  Last Vitals:  Vitals:   12/12/22 0940 12/12/22 0950  BP: 129/75 131/64  Pulse: 71 61  Resp: 12 12  Temp:    SpO2: 98% 95%    Last Pain:  Vitals:   12/12/22 0950  TempSrc:   PainSc: 0-No pain                 Sydny Schnitzler,W. EDMOND

## 2022-12-12 NOTE — Transfer of Care (Signed)
Immediate Anesthesia Transfer of Care Note  Patient: Philip Anderson  Procedure(s) Performed: COLONOSCOPY WITH PROPOFOL HEMOSTASIS CLIP PLACEMENT POLYPECTOMY  Patient Location: PACU and Endoscopy Unit  Anesthesia Type:MAC  Level of Consciousness: awake, alert , and oriented  Airway & Oxygen Therapy: Patient Spontanous Breathing and Patient connected to face mask oxygen  Post-op Assessment: Report given to RN and Post -op Vital signs reviewed and stable  Post vital signs: Reviewed and stable  Last Vitals:  Vitals Value Taken Time  BP    Temp    Pulse 69 12/12/22 0930  Resp 12 12/12/22 0930  SpO2 100 % 12/12/22 0930  Vitals shown include unfiled device data.  Last Pain:  Vitals:   12/12/22 0815  TempSrc: Temporal  PainSc: 0-No pain         Complications: No notable events documented.

## 2022-12-12 NOTE — H&P (Signed)
Date of Initial H&P: 12/07/22  History reviewed, patient examined, no change in status, stable for surgery.

## 2022-12-12 NOTE — Anesthesia Procedure Notes (Signed)
Procedure Name: MAC Date/Time: 12/12/2022 8:42 AM  Performed by: Maurene Capes, CRNAPre-anesthesia Checklist: Patient identified, Emergency Drugs available, Suction available, Patient being monitored and Timeout performed Patient Re-evaluated:Patient Re-evaluated prior to induction Oxygen Delivery Method: Simple face mask Preoxygenation: Pre-oxygenation with 100% oxygen Induction Type: IV induction Placement Confirmation: positive ETCO2 Dental Injury: Teeth and Oropharynx as per pre-operative assessment

## 2022-12-12 NOTE — Op Note (Signed)
[facility] Patient Name: Philip Anderson Procedure Date: 12/12/2022 MRN: [id number] Attending MD: Shirley Friar , MD, [id number] Date of Birth: 02-10-1945 CSN: [id number] Age: 77 Admit Type: Outpatient Procedure:                Colonoscopy Indications:              High risk colon cancer surveillance: Personal                            history of adenoma (10 mm or greater in size), Last                            colonoscopy: August 2019 Providers:                Shirley Friar, MD, Weston Settle, RN, Suzy Bouchard, RN Referring MD:             Shireen Quan, DO Medicines:                Propofol per Anesthesia, Monitored Anesthesia Care Complications:            No immediate complications. Estimated Blood Loss:     Estimated blood loss was minimal. Procedure:                Pre-Anesthesia Assessment:                           - Prior to the procedure, a History and Physical                            was performed, and patient medications and                            allergies were reviewed. The patient's tolerance of                            previous anesthesia was also reviewed. The risks                            and benefits of the procedure and the sedation                            options and risks were discussed with the patient.                            All questions were answered, and informed consent                            was obtained. Prior Anticoagulants: The patient has                            taken no anticoagulant or antiplatelet agents. ASA  Grade Assessment: III - A patient with severe                            systemic disease. After reviewing the risks and                            benefits, the patient was deemed in satisfactory                            condition to undergo the procedure.                           After obtaining informed consent, the colonoscope                             was passed under direct vision. Throughout the                            procedure, the patient's blood pressure, pulse, and                            oxygen saturations were monitored continuously. The                            [id number]-HQ190L ([id number] Olympus colonoscope was                            introduced through the anus and advanced to the the                            cecum, identified by appendiceal orifice and                            ileocecal valve. The colonoscopy was performed                            without difficulty. The patient tolerated the                            procedure well. The quality of the bowel                            preparation was adequate and good. The terminal                            ileum, ileocecal valve, appendiceal orifice, and                            rectum were photographed. Scope In: 8:52:55 AM Scope Out: 9:19:15 AM Scope Withdrawal Time: 0 hours 24 minutes 11 seconds  Total Procedure Duration: 0 hours 26 minutes 20 seconds  Findings:      The perianal and digital rectal examinations were normal.      A 5 mm polyp was found in the descending colon. The polyp was  semi-sessile. The polyp was removed with a hot snare. Resection and       retrieval were complete. Estimated blood loss was minimal. To stop       active bleeding, two hemostatic clips were successfully placed. There       was no bleeding at the end of the procedure.      A 2 mm polyp was found in the descending colon. The polyp was flat. The       polyp was removed with a cold biopsy forceps. Resection and retrieval       were complete. Estimated blood loss was minimal.      Two semi-sessile polyps were found in the sigmoid colon. The polyps were       4 to 5 mm in size. These polyps were removed with a hot snare. Resection       and retrieval were complete. Estimated blood loss: none.      A tattoo was seen in the sigmoid colon. The  tattoo site appeared normal.      An 8 mm polyp was found in the rectum. The polyp was sessile. The polyp       was removed with a hot snare. Resection and retrieval were complete.       Estimated blood loss: none.      Scattered medium-mouthed and small-mouthed diverticula were found in the       sigmoid colon.      Internal hemorrhoids were found during retroflexion. The hemorrhoids       were small and Grade I (internal hemorrhoids that do not prolapse).      The terminal ileum appeared normal. Impression:               - One 5 mm polyp in the descending colon, removed                            with a hot snare. Resected and retrieved. Clips                            were placed.                           - One 2 mm polyp in the descending colon, removed                            with a cold biopsy forceps. Resected and retrieved.                           - Two 4 to 5 mm polyps in the sigmoid colon,                            removed with a hot snare. Resected and retrieved.                           - A tattoo was seen in the sigmoid colon. The                            tattoo site appeared normal.                           -  One 8 mm polyp in the rectum, removed with a hot                            snare. Resected and retrieved.                           - Diverticulosis in the sigmoid colon.                           - Internal hemorrhoids.                           - The examined portion of the ileum was normal. Moderate Sedation:      N/A - MAC procedure Recommendation:           - Patient has a contact number available for                            emergencies. The signs and symptoms of potential                            delayed complications were discussed with the                            patient. Return to normal activities tomorrow.                            Written discharge instructions were provided to the                            patient.                            - Await pathology results.                           - Repeat colonoscopy for surveillance based on                            pathology results. Procedure Code(s):        --- Professional ---                           [id number], Colonoscopy, flexible; with removal of                            tumor(s), polyp(s), or other lesion(s) by snare                            technique                           45380, 59, Colonoscopy, flexible; with biopsy,                            single or multiple Diagnosis Code(s):        ---  Professional ---                           Z86.010, Personal history of colonic polyps                           D12.4, Benign neoplasm of descending colon                           D12.8, Benign neoplasm of rectum                           D12.5, Benign neoplasm of sigmoid colon                           K64.0, First degree hemorrhoids                           K57.30, Diverticulosis of large intestine without                            perforation or abscess without bleeding CPT copyright 2022 [employer]. All rights reserved. The codes documented in this report are preliminary and upon coder review may  be revised to meet current compliance requirements. Shirley Friar, MD 12/12/2022 9:33:09 AM This report has been signed electronically. Number of Addenda: 0

## 2022-12-12 NOTE — Discharge Instructions (Signed)

## 2022-12-13 LAB — SURGICAL PATHOLOGY

## 2022-12-15 ENCOUNTER — Encounter (HOSPITAL_COMMUNITY): Payer: Self-pay | Admitting: Gastroenterology

## 2022-12-25 DIAGNOSIS — K1379 Other lesions of oral mucosa: Secondary | ICD-10-CM | POA: Diagnosis not present

## 2022-12-25 DIAGNOSIS — C310 Malignant neoplasm of maxillary sinus: Secondary | ICD-10-CM | POA: Diagnosis not present

## 2023-01-02 DIAGNOSIS — Z6828 Body mass index (BMI) 28.0-28.9, adult: Secondary | ICD-10-CM | POA: Diagnosis not present

## 2023-01-02 DIAGNOSIS — I10 Essential (primary) hypertension: Secondary | ICD-10-CM | POA: Diagnosis not present

## 2023-01-02 DIAGNOSIS — Z0000 Encounter for general adult medical examination without abnormal findings: Secondary | ICD-10-CM | POA: Diagnosis not present

## 2023-01-02 DIAGNOSIS — Z131 Encounter for screening for diabetes mellitus: Secondary | ICD-10-CM | POA: Diagnosis not present

## 2023-01-02 DIAGNOSIS — E785 Hyperlipidemia, unspecified: Secondary | ICD-10-CM | POA: Diagnosis not present

## 2023-01-02 DIAGNOSIS — N1832 Chronic kidney disease, stage 3b: Secondary | ICD-10-CM | POA: Diagnosis not present

## 2023-01-02 DIAGNOSIS — N529 Male erectile dysfunction, unspecified: Secondary | ICD-10-CM | POA: Diagnosis not present

## 2023-01-02 DIAGNOSIS — Z23 Encounter for immunization: Secondary | ICD-10-CM | POA: Diagnosis not present

## 2023-04-25 DIAGNOSIS — Z8589 Personal history of malignant neoplasm of other organs and systems: Secondary | ICD-10-CM | POA: Diagnosis not present

## 2023-04-25 DIAGNOSIS — I2510 Atherosclerotic heart disease of native coronary artery without angina pectoris: Secondary | ICD-10-CM | POA: Diagnosis not present

## 2023-04-25 DIAGNOSIS — R911 Solitary pulmonary nodule: Secondary | ICD-10-CM | POA: Diagnosis not present

## 2023-04-25 DIAGNOSIS — Z923 Personal history of irradiation: Secondary | ICD-10-CM | POA: Diagnosis not present

## 2023-04-25 DIAGNOSIS — I10 Essential (primary) hypertension: Secondary | ICD-10-CM | POA: Diagnosis not present

## 2023-04-25 DIAGNOSIS — R918 Other nonspecific abnormal finding of lung field: Secondary | ICD-10-CM | POA: Diagnosis not present

## 2023-04-25 DIAGNOSIS — D5912 Cold autoimmune hemolytic anemia: Secondary | ICD-10-CM | POA: Diagnosis not present

## 2023-04-25 DIAGNOSIS — Z8522 Personal history of malignant neoplasm of nasal cavities, middle ear, and accessory sinuses: Secondary | ICD-10-CM | POA: Diagnosis not present

## 2023-04-25 DIAGNOSIS — C310 Malignant neoplasm of maxillary sinus: Secondary | ICD-10-CM | POA: Diagnosis not present

## 2023-04-25 DIAGNOSIS — Z08 Encounter for follow-up examination after completed treatment for malignant neoplasm: Secondary | ICD-10-CM | POA: Diagnosis not present

## 2023-04-25 DIAGNOSIS — Z79899 Other long term (current) drug therapy: Secondary | ICD-10-CM | POA: Diagnosis not present

## 2023-07-03 DIAGNOSIS — Z6827 Body mass index (BMI) 27.0-27.9, adult: Secondary | ICD-10-CM | POA: Diagnosis not present

## 2023-07-03 DIAGNOSIS — N1832 Chronic kidney disease, stage 3b: Secondary | ICD-10-CM | POA: Diagnosis not present

## 2023-07-03 DIAGNOSIS — F1020 Alcohol dependence, uncomplicated: Secondary | ICD-10-CM | POA: Diagnosis not present

## 2023-07-03 DIAGNOSIS — N529 Male erectile dysfunction, unspecified: Secondary | ICD-10-CM | POA: Diagnosis not present

## 2023-07-03 DIAGNOSIS — C310 Malignant neoplasm of maxillary sinus: Secondary | ICD-10-CM | POA: Diagnosis not present

## 2023-07-03 DIAGNOSIS — R972 Elevated prostate specific antigen [PSA]: Secondary | ICD-10-CM | POA: Diagnosis not present

## 2023-07-03 DIAGNOSIS — E782 Mixed hyperlipidemia: Secondary | ICD-10-CM | POA: Diagnosis not present

## 2023-07-03 DIAGNOSIS — I10 Essential (primary) hypertension: Secondary | ICD-10-CM | POA: Diagnosis not present

## 2023-07-25 DIAGNOSIS — Z9221 Personal history of antineoplastic chemotherapy: Secondary | ICD-10-CM | POA: Diagnosis not present

## 2023-07-25 DIAGNOSIS — Z08 Encounter for follow-up examination after completed treatment for malignant neoplasm: Secondary | ICD-10-CM | POA: Diagnosis not present

## 2023-07-25 DIAGNOSIS — S01502D Unspecified open wound of oral cavity, subsequent encounter: Secondary | ICD-10-CM | POA: Diagnosis not present

## 2023-07-25 DIAGNOSIS — Z85841 Personal history of malignant neoplasm of brain: Secondary | ICD-10-CM | POA: Diagnosis not present

## 2023-07-25 DIAGNOSIS — Z923 Personal history of irradiation: Secondary | ICD-10-CM | POA: Diagnosis not present

## 2023-10-03 DIAGNOSIS — C310 Malignant neoplasm of maxillary sinus: Secondary | ICD-10-CM | POA: Diagnosis not present

## 2023-10-03 DIAGNOSIS — N200 Calculus of kidney: Secondary | ICD-10-CM | POA: Diagnosis not present

## 2023-10-03 DIAGNOSIS — N189 Chronic kidney disease, unspecified: Secondary | ICD-10-CM | POA: Diagnosis not present

## 2023-10-03 DIAGNOSIS — N281 Cyst of kidney, acquired: Secondary | ICD-10-CM | POA: Diagnosis not present

## 2023-10-03 DIAGNOSIS — Z08 Encounter for follow-up examination after completed treatment for malignant neoplasm: Secondary | ICD-10-CM | POA: Diagnosis not present

## 2023-10-03 DIAGNOSIS — Z923 Personal history of irradiation: Secondary | ICD-10-CM | POA: Diagnosis not present

## 2023-10-03 DIAGNOSIS — I129 Hypertensive chronic kidney disease with stage 1 through stage 4 chronic kidney disease, or unspecified chronic kidney disease: Secondary | ICD-10-CM | POA: Diagnosis not present

## 2023-10-03 DIAGNOSIS — R918 Other nonspecific abnormal finding of lung field: Secondary | ICD-10-CM | POA: Diagnosis not present

## 2023-10-03 DIAGNOSIS — Z8589 Personal history of malignant neoplasm of other organs and systems: Secondary | ICD-10-CM | POA: Diagnosis not present

## 2023-10-25 DIAGNOSIS — M8730 Other secondary osteonecrosis, unspecified bone: Secondary | ICD-10-CM | POA: Diagnosis not present

## 2023-10-25 DIAGNOSIS — Z8589 Personal history of malignant neoplasm of other organs and systems: Secondary | ICD-10-CM | POA: Diagnosis not present

## 2023-10-25 DIAGNOSIS — Z9889 Other specified postprocedural states: Secondary | ICD-10-CM | POA: Diagnosis not present

## 2023-10-25 DIAGNOSIS — C310 Malignant neoplasm of maxillary sinus: Secondary | ICD-10-CM | POA: Diagnosis not present

## 2023-10-25 DIAGNOSIS — Z9221 Personal history of antineoplastic chemotherapy: Secondary | ICD-10-CM | POA: Diagnosis not present

## 2023-10-25 DIAGNOSIS — Z9009 Acquired absence of other part of head and neck: Secondary | ICD-10-CM | POA: Diagnosis not present

## 2023-10-25 DIAGNOSIS — Z8522 Personal history of malignant neoplasm of nasal cavities, middle ear, and accessory sinuses: Secondary | ICD-10-CM | POA: Diagnosis not present

## 2023-10-25 DIAGNOSIS — Z923 Personal history of irradiation: Secondary | ICD-10-CM | POA: Diagnosis not present

## 2023-10-25 DIAGNOSIS — Z08 Encounter for follow-up examination after completed treatment for malignant neoplasm: Secondary | ICD-10-CM | POA: Diagnosis not present

## 2023-12-26 DIAGNOSIS — K1379 Other lesions of oral mucosa: Secondary | ICD-10-CM | POA: Diagnosis not present
